# Patient Record
Sex: Female | Born: 1950 | Race: White | Hispanic: No | Marital: Married | Smoking: Never smoker
Health system: Southern US, Community
[De-identification: ages and names within clinical notes are randomized; demographics above are authoritative.]

## PROBLEM LIST (undated history)

## (undated) DIAGNOSIS — E7800 Pure hypercholesterolemia, unspecified: Secondary | ICD-10-CM

## (undated) DIAGNOSIS — G309 Alzheimer's disease, unspecified: Secondary | ICD-10-CM

## (undated) DIAGNOSIS — R079 Chest pain, unspecified: Secondary | ICD-10-CM

## (undated) DIAGNOSIS — R5381 Other malaise: Secondary | ICD-10-CM

## (undated) DIAGNOSIS — R413 Other amnesia: Principal | ICD-10-CM

## (undated) DIAGNOSIS — F0280 Dementia in other diseases classified elsewhere without behavioral disturbance: Secondary | ICD-10-CM

## (undated) DIAGNOSIS — F341 Dysthymic disorder: Secondary | ICD-10-CM

## (undated) DIAGNOSIS — K589 Irritable bowel syndrome without diarrhea: Secondary | ICD-10-CM

## (undated) DIAGNOSIS — R0602 Shortness of breath: Secondary | ICD-10-CM

## (undated) DIAGNOSIS — I10 Essential (primary) hypertension: Secondary | ICD-10-CM

## (undated) DIAGNOSIS — R5383 Other fatigue: Secondary | ICD-10-CM

## (undated) HISTORY — DX: Dementia in other diseases classified elsewhere without behavioral disturbance: F02.80

## (undated) HISTORY — DX: Irritable bowel syndrome, unspecified: K58.9

## (undated) HISTORY — DX: Pure hypercholesterolemia, unspecified: E78.00

## (undated) HISTORY — DX: Alzheimer's disease, unspecified: G30.9

## (undated) HISTORY — PX: CHOLECYSTECTOMY: SHX55

## (undated) HISTORY — DX: Shortness of breath: R06.02

## (undated) HISTORY — DX: Chest pain, unspecified: R07.9

## (undated) HISTORY — DX: Essential (primary) hypertension: I10

## (undated) HISTORY — DX: Other amnesia: R41.3

## (undated) HISTORY — DX: Other fatigue: R53.83

## (undated) HISTORY — DX: Dysthymic disorder: F34.1

## (undated) HISTORY — DX: Other malaise: R53.81

## (undated) HISTORY — PX: TONSILLECTOMY: SUR1361

## (undated) HISTORY — PX: APPENDECTOMY: SHX54

---

## 1997-05-31 ENCOUNTER — Encounter: Admission: RE | Admit: 1997-05-31 | Discharge: 1997-08-29 | Payer: Self-pay

## 2003-11-30 ENCOUNTER — Ambulatory Visit (HOSPITAL_COMMUNITY): Admission: RE | Admit: 2003-11-30 | Discharge: 2003-11-30 | Payer: Self-pay | Admitting: Internal Medicine

## 2003-11-30 ENCOUNTER — Ambulatory Visit: Payer: Self-pay | Admitting: Internal Medicine

## 2004-04-24 ENCOUNTER — Ambulatory Visit: Payer: Self-pay | Admitting: Family Medicine

## 2004-08-11 ENCOUNTER — Ambulatory Visit: Payer: Self-pay | Admitting: Family Medicine

## 2004-09-09 ENCOUNTER — Ambulatory Visit: Payer: Self-pay | Admitting: Family Medicine

## 2004-10-22 ENCOUNTER — Ambulatory Visit: Payer: Self-pay | Admitting: Family Medicine

## 2004-11-10 ENCOUNTER — Ambulatory Visit: Payer: Self-pay | Admitting: Family Medicine

## 2004-12-18 ENCOUNTER — Ambulatory Visit: Payer: Self-pay | Admitting: Family Medicine

## 2005-04-10 ENCOUNTER — Ambulatory Visit: Payer: Self-pay | Admitting: Family Medicine

## 2005-04-14 ENCOUNTER — Ambulatory Visit: Payer: Self-pay | Admitting: Family Medicine

## 2005-04-20 ENCOUNTER — Ambulatory Visit: Payer: Self-pay | Admitting: Family Medicine

## 2005-07-01 ENCOUNTER — Ambulatory Visit: Payer: Self-pay | Admitting: Family Medicine

## 2005-11-05 ENCOUNTER — Ambulatory Visit: Payer: Self-pay | Admitting: Family Medicine

## 2010-04-11 ENCOUNTER — Encounter: Payer: Self-pay | Admitting: Cardiology

## 2010-05-12 ENCOUNTER — Encounter: Payer: Self-pay | Admitting: *Deleted

## 2010-05-12 NOTE — Progress Notes (Signed)
Letter mailed

## 2010-05-13 ENCOUNTER — Ambulatory Visit (INDEPENDENT_AMBULATORY_CARE_PROVIDER_SITE_OTHER): Payer: BC Managed Care – PPO | Admitting: Cardiology

## 2010-05-13 ENCOUNTER — Encounter: Payer: Self-pay | Admitting: *Deleted

## 2010-05-13 ENCOUNTER — Encounter: Payer: Self-pay | Admitting: Cardiology

## 2010-05-13 VITALS — BP 117/81 | HR 73 | Ht 70.0 in | Wt 173.8 lb

## 2010-05-13 DIAGNOSIS — I10 Essential (primary) hypertension: Secondary | ICD-10-CM | POA: Insufficient documentation

## 2010-05-13 DIAGNOSIS — R079 Chest pain, unspecified: Secondary | ICD-10-CM | POA: Insufficient documentation

## 2010-05-13 DIAGNOSIS — R0602 Shortness of breath: Secondary | ICD-10-CM | POA: Insufficient documentation

## 2010-05-13 DIAGNOSIS — E7800 Pure hypercholesterolemia, unspecified: Secondary | ICD-10-CM

## 2010-05-13 NOTE — Assessment & Plan Note (Signed)
The patient's chest pain is atypical. However, she has a very significant history of early coronary disease and an abnormal EKG. Her pretest probability of obstructive coronary disease is at least moderate. Given this exercise perfusion imaging is the appropriate indicated study by ACC/AHA guidelines.

## 2010-05-13 NOTE — Progress Notes (Signed)
HPI The patient presents for evaluation of chest discomfort. She says she's had this for about 4 or 5 weeks. However, she does not think she's had it recently. I do see that she was in the emergency room on the 13th of this month. I reviewed an EKG which demonstrated lateral T wave inversions which were new compared to previous EKGs. She did not have elevated cardiac enzymes and she had no PE on CT. She reports that she had dyspnea and chest discomfort when chasing a child. She otherwise has intermittent discomfort. She will get some dyspnea climbing her stairs but this is her most significant exertion. She is not describing reproducible chest discomfort, neck or arm discomfort at rest or with exertion but again she sedentary. She's not having any shortness of breath, PND or orthopnea. She's had no nausea vomiting or diaphoresis. She is not describing new palpitations or syncope though she has had some lightheadedness. She has not had prior cardiovascular testing.  Allergies  Allergen Reactions  . Sodium Pentobarbital (Pentobarbital Sodium) Nausea And Vomiting    Current Outpatient Prescriptions  Medication Sig Dispense Refill  . atorvastatin (LIPITOR) 10 MG tablet Take 10 mg by mouth at bedtime.        Marland Kitchen buPROPion (WELLBUTRIN XL) 300 MG 24 hr tablet Take 300 mg by mouth daily.        . fish oil-omega-3 fatty acids 1000 MG capsule Take 2 g by mouth 3 (three) times daily.        . fluticasone (VERAMYST) 27.5 MCG/SPRAY nasal spray 2 sprays by Nasal route daily.        Marland Kitchen LORazepam (ATIVAN) 0.5 MG tablet Take 0.5 mg by mouth 3 (three) times daily.       Marland Kitchen omeprazole (PRILOSEC) 20 MG capsule Take 20 mg by mouth daily.        . ondansetron (ZOFRAN) 4 MG tablet Take 4 mg by mouth every 8 (eight) hours as needed.        . temazepam (RESTORIL) 15 MG capsule Take 30 mg by mouth at bedtime as needed.       . venlafaxine (EFFEXOR-XR) 150 MG 24 hr capsule Take 150 mg by mouth daily.        Marland Kitchen aspirin 81 MG  tablet Take 81 mg by mouth daily.        Marland Kitchen DISCONTD: alendronate (FOSAMAX) 70 MG tablet Take 70 mg by mouth every 7 (seven) days. Take with a full glass of water on an empty stomach.       . DISCONTD: celecoxib (CELEBREX) 200 MG capsule Take 200 mg by mouth daily.        Marland Kitchen DISCONTD: dexlansoprazole (DEXILANT) 60 MG capsule Take 60 mg by mouth daily.          Past Medical History  Diagnosis Date  . Shortness of breath   . Chest pain, unspecified   . Other malaise and fatigue   . Dysthymic disorder   . Unspecified essential hypertension   . Pure hypercholesterolemia     Past Surgical History  Procedure Date  . Tonsillectomy   . Appendectomy     Family History  Problem Relation Age of Onset  . Coronary artery disease Father 78    Died MI age 9  . Alzheimer's disease Mother     Pacemaker  . Coronary artery disease Brother 40    Heart attacks in his 31s    History   Social History  . Marital Status: Married  Spouse Name: [name]    Number of Children: 1  . Years of Education: N/A   Occupational History  . RETIRED    Social History Main Topics  . Smoking status: Never Smoker   . Smokeless tobacco: Not on file  . Alcohol Use: No  . Drug Use: No  . Sexually Active: Not on file   Other Topics Concern  . Not on file   Social History Narrative  . No narrative on file    ROS:  Positive for fatigue, blurred vision, depression, anxiety, difficulty swallowing, heartburn, hemorrhoids. Otherwise as stated in the history of present illness negative for all other systems.  PHYSICAL EXAM BP 117/81  Pulse 73  Ht 5\' 10"  (1.778 m)  Wt 173 lb 12.8 oz (78.835 kg)  BMI 24.94 kg/m2 GENERAL:  Well appearing HEENT:  Pupils equal round and reactive, fundi not visualized, oral mucosa unremarkable NECK:  No jugular venous distention, waveform within normal limits, carotid upstroke brisk and symmetric, no bruits, no thyromegaly LYMPHATICS:  No cervical, inguinal  adenopathy LUNGS:  Clear to auscultation bilaterally BACK:  No CVA tenderness CHEST:  Unremarkable HEART:  PMI not displaced or sustained,S1 and S2 within normal limits, no S3, no S4, no clicks, no rubs, no murmurs ABD:  Flat, positive bowel sounds normal in frequency in pitch, no bruits, no rebound, no guarding, no midline pulsatile mass, no hepatomegaly, no splenomegaly EXT:  2 plus pulses throughout, no edema, no cyanosis no clubbing SKIN:  No rashes no nodules NEURO:  Cranial nerves II through XII grossly intact, motor grossly intact throughout PSYCH:  Cognitively intact, oriented to person place and time   EKG:  See above  ASSESSMENT AND PLAN

## 2010-05-13 NOTE — Patient Instructions (Signed)
Your physician has requested that you have en exercise stress cardiolite. For further information please visit [email or web address] Please follow instruction sheet, as given. Your follow up will be based on your test results. If the results of your test are normal or stable, you will receive a letter. If they are abnormal, the nurse will contact you by phone.

## 2010-05-13 NOTE — Assessment & Plan Note (Signed)
Her blood pressure is controlled and she will continue the meds as listed 

## 2010-05-13 NOTE — Assessment & Plan Note (Signed)
I would suggest a goal LDL less than 100 and HDL greater than 40 and I will defer to her primary provider.

## 2010-05-23 DIAGNOSIS — R079 Chest pain, unspecified: Secondary | ICD-10-CM

## 2010-06-04 ENCOUNTER — Other Ambulatory Visit (HOSPITAL_COMMUNITY): Payer: BC Managed Care – PPO

## 2010-06-05 ENCOUNTER — Other Ambulatory Visit (HOSPITAL_COMMUNITY): Payer: BC Managed Care – PPO

## 2010-06-06 ENCOUNTER — Other Ambulatory Visit: Payer: Self-pay | Admitting: General Surgery

## 2010-06-06 ENCOUNTER — Encounter (HOSPITAL_COMMUNITY): Payer: BC Managed Care – PPO

## 2010-06-06 LAB — COMPREHENSIVE METABOLIC PANEL
ALT: 13 U/L (ref 0–35)
AST: 17 U/L (ref 0–37)
Albumin: 4.1 g/dL (ref 3.5–5.2)
Alkaline Phosphatase: 71 U/L (ref 39–117)
Alkaline Phosphatase: 71 U/L (ref 39–117)
BUN: 10 mg/dL (ref 6–23)
BUN: 10 mg/dL (ref 6–23)
CO2: 28 mEq/L (ref 19–32)
CO2: 28 mEq/L (ref 19–32)
Calcium: 9.4 mg/dL (ref 8.4–10.5)
Chloride: 103 mEq/L (ref 96–112)
Creatinine, Ser: 0.88 mg/dL (ref 0.4–1.2)
GFR calc Af Amer: >60 mL/min (ref >60)
GFR calc non Af Amer: >60 mL/min (ref >60)
GFR calc non Af Amer: >60 mL/min (ref >60)
Glucose, Bld: 86 mg/dL (ref 70–99)
Glucose, Bld: 86 mg/dL (ref 70–99)
Potassium: 4.3 mEq/L (ref 3.5–5.1)
Sodium: 140 mEq/L (ref 135–145)
Total Bilirubin: 0.6 mg/dL (ref 0.3–1.2)
Total Bilirubin: 0.6 mg/dL (ref 0.3–1.2)
Total Protein: 7.2 g/dL (ref 6.0–8.3)
Total Protein: 7.2 g/dL (ref 6.0–8.3)

## 2010-06-06 LAB — CBC
HCT: 38.5 % (ref 36.0–46.0)
MCHC: 31.7 g/dL (ref 30.0–36.0)
MCV: 76.8 fL — ABNORMAL LOW (ref 78.0–100.0)
RDW: 14.4 % (ref 11.5–15.5)
WBC: 5.1 10*3/uL (ref 4.0–10.5)

## 2010-06-06 LAB — DIFFERENTIAL
Eosinophils Relative: 1 % (ref 0–5)
Lymphocytes Relative: 39 % (ref 12–46)
Lymphs Abs: 2 10*3/uL (ref 0.7–4.0)
Monocytes Absolute: 0.5 10*3/uL (ref 0.1–1.0)

## 2010-06-06 LAB — SURGICAL PCR SCREEN: MRSA, PCR: NEGATIVE

## 2010-06-12 ENCOUNTER — Other Ambulatory Visit: Payer: Self-pay | Admitting: General Surgery

## 2010-06-12 ENCOUNTER — Ambulatory Visit (HOSPITAL_COMMUNITY): Payer: BC Managed Care – PPO

## 2010-06-12 ENCOUNTER — Ambulatory Visit (HOSPITAL_COMMUNITY)
Admission: RE | Admit: 2010-06-12 | Discharge: 2010-06-12 | Disposition: A | Discharge status: Home / Self Care | Payer: BC Managed Care – PPO | Source: Ambulatory Visit | Attending: General Surgery | Admitting: General Surgery

## 2010-06-12 DIAGNOSIS — K8010 Calculus of gallbladder with chronic cholecystitis without obstruction: Secondary | ICD-10-CM | POA: Insufficient documentation

## 2010-06-12 DIAGNOSIS — F341 Dysthymic disorder: Secondary | ICD-10-CM | POA: Insufficient documentation

## 2010-06-12 DIAGNOSIS — R1013 Epigastric pain: Secondary | ICD-10-CM | POA: Insufficient documentation

## 2010-06-12 DIAGNOSIS — K219 Gastro-esophageal reflux disease without esophagitis: Secondary | ICD-10-CM | POA: Insufficient documentation

## 2010-06-18 NOTE — Op Note (Signed)
[name], [name]             ACCOUNT NO.:  [id number]  MEDICAL RECORD NO.:  [id number]           PATIENT TYPE:  O  LOCATION:  DAYL                         FACILITY:  [facility]  PHYSICIAN:  Anselm Pancoast. Nechama Escutia, M.D.DATE OF BIRTH:  [birth year]  DATE OF PROCEDURE:  06/12/2010 DATE OF DISCHARGE:  06/12/2010                              OPERATIVE REPORT   PREOPERATIVE DIAGNOSIS:  Chronic cholecystitis with stones.  OPERATIONS:  Laparoscopic cholecystectomy with cholangiogram.  ANESTHESIA:  General anesthesia.  HISTORY:  Ms. Destiny Lopez is a 60 year old Caucasian female, who was referred to [place] at the courtesy of Dr. Lysbeth Galas with symptomatic gallstones. Patient has a history of acid reflux, high fever, past history of depression and has had episodes of epigastric pain and she has had a known history of gallstones she says for about 10 years and thought that symptoms were related to reflux.  Recently about 4:00 a.m., she had severe onset of epigastric pain, went to the chest, went to the Emergency Room at [facility] and they did a combination of test.  Her white count was not elevated.  Her hematocrit was normal.  Liver tests were not elevated and the pain went into her chest.  I got a chest x- ray, EKG.  They have a chest CT one and she had pulmonary embolism. They did repeated an ultrasound and it showed that she had small gallstones.  The chest CT showed a little nodule in her thyroid that has been worked up.  She has not been informed of what the results of the biopsy was, but the ultrasound showed that she had numerous little stones within the gallbladder and most likely the symptoms of pain going up into her chest was probably a passage of the common duct stone.  She was seen in our office approximately 3 weeks ago and I recommended that we go ahead and proceed with the laparoscopic cholecystectomy and cholangiogram since these episodes of pain and hopefully symptoms that she has  previously contributed to acid reflux possibly could be related to gallstones.  DESCRIPTION OF PROCEDURE:  Patient's preoperatively repeat liver function studies were normal and we gave her 3 grams of Unasyn.  She has PAS stockings, induction of general anesthesia, endotracheal tube and then the abdomen was prepped with Betadine solution and draped in a sterile manner.  A small incision was made below the umbilicus.  The fascia was identified.  A small vertical incision was made.  Two Kochers were used and then the underlying peritoneum were carefully opened with a blunt hemostat and a pursestring suture of 0 Vicryl placed.  The gallbladder was not acutely inflamed.  It was fairly prominent in size. The upper 10 mm trocar was placed under direct vision after anesthetizing the fascia and then Dr. Magnus Ivan, the assistant placed two lateral 5 mm trocars.  She had a lot of adhesions up around the gallbladder, which were carefully taken down, exposing the more proximal portion of the gallbladder.  The cystic duct was a little bit prominent and I put a clip on the junction of the gallbladder cystic duct and then kind of milked the cystic duct,  could not get any actual stones, just a little bit of cholesterol debris and then a catheter was introduced, held in place with a clip and the x-ray obtained.  The contrast goes easily through the distal common bile duct into the duodenum and then with a kind of more forceful injection, I could get the contrast reflux up into the common hepatic and you could see the bifurcation.  There were no obvious stones in the common bile duct or common hepatic duct and then the camera was reinserted and the cystic duct.  I removed the catheter, kind of milked the cystic duct again and then put three clips across the cystic duct and then divided it where I had made the little opening for the cholangiocath.  The proximal portion of the gallbladder identified the  cystic artery that was doubly clipped and then divided distally and then the gallbladder was freed from its bed.  There was a little bit of bile spillage as we were trying to dissect out the gallbladder from the liver and after the gallbladder was freed, I placed it into the EndoCatch bag.  I thoroughly irrigated, reinspected the gallbladder fossa.  There was a couple of little areas that were likely irrigated.  The clips appeared to be in good position and all the irrigating fluid was aspirated.  The camera was then switched to the upper 10 mm port.  The bag containing the gallbladder was withdrawn in the umbilicus and I put an additional figure-of-eight of 0 Vicryl, tied both it in the pursestring and then anesthetized the fascia with Marcaine with adrenaline.  The CO2 was released.  The little bit of remaining irrigating fluid had been aspirated and then the upper 10 mm trocar was withdrawn after the 5-mm ports had been withdrawn under direct vision.  The subcutaneous wounds were closed with 4-0 Monocryl. Benzoin and Steri-Strips were placed on the skin.  Patient tolerated the procedure nicely and I placed one of the small stones into a little separate container to get it to the patient's husband in case there would be any other stones in the common bile duct that were not seen on the cholangiogram that I would pass, but they would give her another episode of pain like she has had when she presented to the Emergency Room at [facility].     Anselm Pancoast. Zachery Dakins, M.D.     WJW/MEDQ  D:  06/12/2010  T:  06/12/2010  Job:  [id number]  cc:   Anselm Pancoast. Zachery Dakins, M.D. [address] [place] [address]  Delaney Meigs, M.D. Fax: [phone]  Electronically Signed by Consuello Bossier M.D. on 06/18/2010 01:38:09 PM

## 2011-06-04 ENCOUNTER — Other Ambulatory Visit: Payer: Self-pay | Admitting: Obstetrics and Gynecology

## 2011-08-11 DIAGNOSIS — R413 Other amnesia: Secondary | ICD-10-CM

## 2011-08-18 DIAGNOSIS — R413 Other amnesia: Secondary | ICD-10-CM

## 2011-11-19 DIAGNOSIS — E042 Nontoxic multinodular goiter: Secondary | ICD-10-CM | POA: Insufficient documentation

## 2011-11-19 DIAGNOSIS — F329 Major depressive disorder, single episode, unspecified: Secondary | ICD-10-CM | POA: Insufficient documentation

## 2011-11-19 DIAGNOSIS — E785 Hyperlipidemia, unspecified: Secondary | ICD-10-CM | POA: Insufficient documentation

## 2011-11-19 DIAGNOSIS — M8580 Other specified disorders of bone density and structure, unspecified site: Secondary | ICD-10-CM | POA: Insufficient documentation

## 2012-09-18 ENCOUNTER — Other Ambulatory Visit: Payer: Self-pay

## 2012-09-18 MED ORDER — DONEPEZIL HCL 5 MG PO TABS: 5.0000 mg | ORAL_TABLET | Freq: Every day | ORAL | Status: DC

## 2012-10-04 ENCOUNTER — Encounter: Payer: Self-pay | Admitting: Nurse Practitioner

## 2012-10-04 ENCOUNTER — Ambulatory Visit (INDEPENDENT_AMBULATORY_CARE_PROVIDER_SITE_OTHER): Payer: BC Managed Care – PPO | Admitting: Nurse Practitioner

## 2012-10-04 VITALS — BP 140/78 | HR 66 | Ht 68.0 in | Wt 175.0 lb

## 2012-10-04 DIAGNOSIS — F079 Unspecified personality and behavioral disorder due to known physiological condition: Secondary | ICD-10-CM | POA: Insufficient documentation

## 2012-10-04 DIAGNOSIS — R9409 Abnormal results of other function studies of central nervous system: Secondary | ICD-10-CM | POA: Insufficient documentation

## 2012-10-04 NOTE — Progress Notes (Signed)
I have read the note, and I agree with the clinical assessment and plan.  Justen Fonda KEITH   

## 2012-10-04 NOTE — Patient Instructions (Addendum)
Continue Aricept 5 mg daily, call for refills Memory score is stable Followup yearly and when necessary

## 2012-10-04 NOTE — Progress Notes (Signed)
[facility]  PATIENT: Destiny Lopez DOB: [birth year]   REASON FOR VISIT: Followup for memory disorder   HISTORY OF PRESENT ILLNESS: Destiny Lopez is a 62 year old right-handed white female with a history of a slowly progressive cognitive disorder. She was last seen in this office by Dr. Anne Hahn 09/29/2011 .The patient has undergone MRI evaluation of the brain showing hemosiderin deposits on the left hemisphere and generalized cerebral atrophy and mild degree of chronic microvascular ischemia The patient indicates that she has not had a prior history of head trauma. 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 The patient has not had any significant changes in her underlying depression. MRA of the brain after her last visit shows atherosclerosis but no aneurysm. On return visit today patient is hesitant to answer questions. Husband reports word finding problems, more confusion in conversations. He continues to work and is worried about her being left alone at home. She is currently on Aricept denying any side effects to the medication. She can still perform her activities of daily living however she cannot manage finances and other activities that require executive function.    REVIEW OF SYSTEMS: Full 14 system review of systems performed and notable only for:  Constitutional: N/A  Cardiovascular: N/A  Ear/Nose/Throat: Ringing in the ears  Skin: N/A  Eyes: N/A  Respiratory: N/A  Gastroitestinal: Constipation  Hematology/Lymphatic: N/A  Endocrine: N/A Musculoskeletal:N/A  Allergy/Immunology: N/A  Neurological: Memory loss  Psychiatric: N/A   ALLERGIES: Allergies  Allergen Reactions  . Sodium Pentobarbital [Pentobarbital Sodium] Nausea And Vomiting    HOME MEDICATIONS: Outpatient Prescriptions Prior to Visit    Medication Sig Dispense Refill  . aspirin 81 MG tablet Take 81 mg by mouth daily.        Marland Kitchen atorvastatin (LIPITOR) 10 MG tablet Take 10 mg by mouth at bedtime.        Marland Kitchen buPROPion (WELLBUTRIN XL) 300 MG 24 hr tablet Take 300 mg by mouth daily.        Marland Kitchen donepezil (ARICEPT) 5 MG tablet Take 1 tablet (5 mg total) by mouth at bedtime.  30 tablet  0  . fish oil-omega-3 fatty acids 1000 MG capsule Take 2 g by mouth 3 (three) times daily.        . fluticasone (VERAMYST) 27.5 MCG/SPRAY nasal spray 2 sprays by Nasal route daily.        Marland Kitchen LORazepam (ATIVAN) 0.5 MG tablet Take 0.5 mg by mouth 3 (three) times daily.       Marland Kitchen omeprazole (PRILOSEC) 20 MG capsule Take 20 mg by mouth daily.        . ondansetron (ZOFRAN) 4 MG tablet Take 4 mg by mouth every 8 (eight) hours as needed.        . temazepam (RESTORIL) 15 MG capsule Take 30 mg by mouth at bedtime as needed.       . venlafaxine (EFFEXOR-XR) 150 MG 24 hr capsule Take 150 mg by mouth daily.         No facility-administered medications prior to visit.    PAST MEDICAL HISTORY: Past Medical History  Diagnosis Date  . Shortness of breath   . Chest pain, unspecified   . Other malaise and fatigue   . Dysthymic disorder   . Unspecified essential hypertension   . Pure hypercholesterolemia   . Memory loss     PAST SURGICAL  HISTORY: Past Surgical History  Procedure Laterality Date  . Tonsillectomy    . Appendectomy      FAMILY HISTORY: Family History  Problem Relation Age of Onset  . Coronary artery disease Father 58    Died MI age 13  . Alzheimer's disease Mother     Pacemaker  . Coronary artery disease Brother 40    Heart attacks in his 16s    SOCIAL HISTORY: History   Social History  . Marital Status: Married    Spouse Name: [name]    Number of Children: 1  . Years of Education: College degree   Occupational History  . RETIRED    Social History Main Topics  . Smoking status: Never Smoker   . Smokeless tobacco: Never Used   . Alcohol Use: No  . Drug Use: No  . Sexual Activity: Not on file   Other Topics Concern  . Not on file   Social History Narrative  . No narrative on file     PHYSICAL EXAM  Filed Vitals:   10/04/12 0927  BP: 140/78  Pulse: 66  Height: 5\' 8"  (1.727 m)  Weight: 175 lb (79.379 kg)   Body mass index is 26.61 kg/(m^2).  Generalized: Well developed, in no acute distress  Head: normocephalic and atraumatic,. Oropharynx benign  Neck: Supple, no carotid bruits  Cardiac: Regular rate rhythm, no murmur    Neurological examination   Mentation: Alert oriented to time, place, history taking. MMSE 29/30. AFT 8. Follows all commands speech is hesitent. Word finding difficulty.   Cranial nerve II-XII: Pupils were equal round reactive to light extraocular movements were full, visual field were full on confrontational test. Facial sensation and strength were normal. hearing was intact to finger rubbing bilaterally. Uvula tongue midline. head turning and shoulder shrug and were normal and symmetric.Tongue protrusion into cheek strength was normal. Motor: normal bulk and tone, full strength in the BUE, BLE, fine finger movements normal, no pronator drift. No focal weakness Coordination: finger-nose-finger, heel-to-shin bilaterally, no dysmetria Reflexes: Brachioradialis 2/2, biceps 2/2, triceps 2/2, patellar 2/2, Achilles 2/2, plantar responses were flexor bilaterally. Gait and Station: Rising up from seated position without assistance, normal stance, without trunk ataxia, moderate stride, good arm swing, smooth turning, able to perform tiptoe, and heel walking without difficulty. Tandem is unsteady   DIAGNOSTIC DATA (LABS, IMAGING, TESTING) -None to review    ASSESSMENT AND PLAN  62 y.o. year old female  has a past medical history of progressive cognitive disorder and memory loss. here for followup . Patient is currently on Aricept 5 mg daily tolerating without side effects.  Continue  Aricept 5 mg daily, call for refills Memory score is stable Additional 10 minutes spent with husband discussing day care options, [place] live in [place] and he is not aware if there are any daycare centers. I suggest he check with local mental health and they should be able to give him options. Also discussed safety issues in the home. Husband continues to work full-time. Followup yearly and when necessary  Nilda Riggs, [facility], [facility], APRN  [facility] [address] [place], [place] [address] [phone]

## 2012-10-13 ENCOUNTER — Other Ambulatory Visit: Payer: Self-pay | Admitting: Obstetrics and Gynecology

## 2012-10-24 ENCOUNTER — Other Ambulatory Visit: Payer: Self-pay | Admitting: Obstetrics and Gynecology

## 2012-10-24 DIAGNOSIS — R928 Other abnormal and inconclusive findings on diagnostic imaging of breast: Secondary | ICD-10-CM

## 2012-11-07 ENCOUNTER — Other Ambulatory Visit: Payer: BC Managed Care – PPO

## 2012-11-14 ENCOUNTER — Other Ambulatory Visit: Payer: BC Managed Care – PPO

## 2012-11-15 ENCOUNTER — Other Ambulatory Visit: Payer: Self-pay | Admitting: Neurology

## 2012-11-29 ENCOUNTER — Ambulatory Visit
Admission: RE | Admit: 2012-11-29 | Discharge: 2012-11-29 | Disposition: A | Discharge status: Home / Self Care | Payer: BC Managed Care – PPO | Source: Ambulatory Visit | Attending: Obstetrics and Gynecology | Admitting: Obstetrics and Gynecology

## 2012-11-29 DIAGNOSIS — R928 Other abnormal and inconclusive findings on diagnostic imaging of breast: Secondary | ICD-10-CM

## 2012-12-23 ENCOUNTER — Other Ambulatory Visit: Payer: Self-pay | Admitting: Neurology

## 2013-03-17 ENCOUNTER — Telehealth: Payer: Self-pay | Admitting: Neurology

## 2013-03-17 NOTE — Telephone Encounter (Signed)
Dr. Nolen MuMcKinney called concerning this patient. The patient apparently has had a significant change in her cognitive functioning issues over the last several months. We will try to get the patient worked in. In the past, she has had an abnormal MRI the brain with hemosiderin deposits over the left brain. We may need to recheck the MRI. The patient is on low-dose Aricept, and probably could go up on the dose if there are no tolerance issues. I will get a revisit work in.

## 2013-04-10 ENCOUNTER — Encounter: Payer: Self-pay | Admitting: Neurology

## 2013-04-10 ENCOUNTER — Ambulatory Visit (INDEPENDENT_AMBULATORY_CARE_PROVIDER_SITE_OTHER): Payer: BC Managed Care – PPO | Admitting: Neurology

## 2013-04-10 ENCOUNTER — Encounter (INDEPENDENT_AMBULATORY_CARE_PROVIDER_SITE_OTHER): Payer: Self-pay

## 2013-04-10 VITALS — BP 178/76 | HR 52 | Wt 156.0 lb

## 2013-04-10 DIAGNOSIS — R413 Other amnesia: Secondary | ICD-10-CM

## 2013-04-10 DIAGNOSIS — R930 Abnormal findings on diagnostic imaging of skull and head, not elsewhere classified: Secondary | ICD-10-CM

## 2013-04-10 HISTORY — DX: Other amnesia: R41.3

## 2013-04-10 MED ORDER — DONEPEZIL HCL 10 MG PO TABS: 10.0000 mg | ORAL_TABLET | Freq: Every day | ORAL | Status: DC

## 2013-04-10 NOTE — Progress Notes (Signed)
Reason for visit: Memory disorder  Destiny Lopez is an 63 y.o. female  History of present illness:  Destiny Lopez is a 63 year old right-handed white female with a history of a progressive memory disorder. The patient was last seen in September 2014. The patient has had some decline in memory and verbal capacity since that time. The patient is followed by psychiatry, and her psychiatrist felt that there was a significant decline in cognitive ability when she was seen recently. The husband appears to confirm that there has been some changes, it is not clear exactly how rapidly these changes came on. The patient is on low-dose Aricept taking 5 mg at night. The patient has had some recent diarrhea, but she has resolved this issue. The patient has ongoing problems with a runny nose associated with the use of Aricept. The patient returns for an evaluation. The patient still operating a motor vehicle, and she does fairly well when she travels to local areas around her home.  Past Medical History  Diagnosis Date  . Shortness of breath   . Chest pain, unspecified   . Other malaise and fatigue   . Dysthymic disorder   . Unspecified essential hypertension   . Pure hypercholesterolemia   . Memory loss   . Memory disorder 04/10/2013    Past Surgical History  Procedure Laterality Date  . Tonsillectomy    . Appendectomy      Family History  Problem Relation Age of Onset  . Coronary artery disease Father 35    Died MI age 59  . Alzheimer's disease Mother     Pacemaker  . Coronary artery disease Brother 40    Heart attacks in his 64s    Social history:  reports that she has never smoked. She has never used smokeless tobacco. She reports that she does not drink alcohol or use illicit drugs.    Allergies  Allergen Reactions  . Sodium Pentobarbital [Pentobarbital Sodium] Nausea And Vomiting    Medications:  Current Outpatient Prescriptions on File Prior to Visit  Medication Sig  Dispense Refill  . aspirin 81 MG tablet Take 81 mg by mouth daily.        Marland Kitchen atorvastatin (LIPITOR) 10 MG tablet Take 10 mg by mouth at bedtime.        Marland Kitchen buPROPion (WELLBUTRIN XL) 300 MG 24 hr tablet Take 300 mg by mouth daily.        . fish oil-omega-3 fatty acids 1000 MG capsule Take 2 g by mouth 3 (three) times daily.        . fluticasone (VERAMYST) 27.5 MCG/SPRAY nasal spray 2 sprays by Nasal route daily.        Marland Kitchen LORazepam (ATIVAN) 0.5 MG tablet Take 0.5 mg by mouth 3 (three) times daily.       Marland Kitchen omeprazole (PRILOSEC) 20 MG capsule Take 20 mg by mouth daily.        . ondansetron (ZOFRAN) 4 MG tablet Take 4 mg by mouth every 8 (eight) hours as needed.        . temazepam (RESTORIL) 15 MG capsule Take 30 mg by mouth at bedtime as needed.       . venlafaxine (EFFEXOR-XR) 150 MG 24 hr capsule Take 150 mg by mouth daily.         No current facility-administered medications on file prior to visit.    ROS:  Out of a complete 14 system review of symptoms, the patient complains only of the  following symptoms, and all other reviewed systems are negative.  Memory disorder, speech problems  Blood pressure 178/76, pulse 52, weight 156 lb (70.761 kg).  Physical Exam  General: The patient is alert and cooperative at the time of the examination.  Skin: No significant peripheral edema is noted.   Neurologic Exam  Mental status: The Mini-Mental status examination done today shows a total score of 27/30.  Cranial nerves: Facial symmetry is present. Speech is normal, no aphasia or dysarthria is noted. Extraocular movements are full. Visual fields are full.  Motor: The patient has good strength in all 4 extremities.  Sensory examination: Sulfa sensation is symmetric on the face, arms, and legs.  Coordination: The patient has good finger-nose-finger and heel-to-shin bilaterally.  Gait and station: The patient has a normal gait. Tandem gait is normal. Romberg is negative. No drift is seen. Mild  apraxia with the use of the extremities is noted.  Reflexes: Deep tendon reflexes are symmetric.   Assessment/Plan:  1. Progressive memory disorder  2. Abnormal MRI brain  The patient has a history of hemosiderin deposits over the left hemisphere the brain. The patient has had some gradual worsening of speech, and her psychiatrist is concerned that this process has significantly accelerated. The patient will be sent for MRI evaluation of the brain once again. 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 The patient will followup in about 6 months.  Marlan Palau. Keith Willis MD 04/10/2013 8:28 PM  [facility] [address] [place], [place] [id number]  Phone [phone] Fax [phone]

## 2013-04-26 ENCOUNTER — Ambulatory Visit (INDEPENDENT_AMBULATORY_CARE_PROVIDER_SITE_OTHER): Payer: BC Managed Care – PPO

## 2013-04-26 DIAGNOSIS — R930 Abnormal findings on diagnostic imaging of skull and head, not elsewhere classified: Secondary | ICD-10-CM

## 2013-04-26 DIAGNOSIS — R413 Other amnesia: Secondary | ICD-10-CM

## 2013-04-27 ENCOUNTER — Telehealth: Payer: Self-pay | Admitting: Neurology

## 2013-04-27 NOTE — Telephone Encounter (Signed)
I called patient. MRI the brain continues show hemosiderin on the left parietal area and left frontal area, similar to what was seen previously. The patient is on Aricept at 10 mg at night at this point. The prior study was done in 2013 at [facility].   MRI brain 04/27/2013:  IMPRESSION:  Abnormal MRI brain (without) demonstrating:  1. Mild frontal and moderate perisylvian atrophy.  2. Cortical hemosiderin staining in the left parietal region, and slightly in the left frontal region. Could be related to amyloid pathology, prior trauma, occult vascular malformation or prior subdural hematoma.  3. Prior MRI study not available at time of interpretation. Addendum will be made when prior comparison is available.

## 2013-04-28 ENCOUNTER — Telehealth: Payer: Self-pay | Admitting: Neurology

## 2013-04-28 NOTE — Telephone Encounter (Signed)
Patient's husband called to state that he would like a call back regarding patient's MRI results. Patient has dementia and could not remember what was discussed in the phone call with Dr Anne HahnWillis. Please call patient's husband at his cell phone number.

## 2013-04-28 NOTE — Telephone Encounter (Signed)
I called patient. I talked with her husband. Discussed the results of the MRI study. No change from prior study. The patient is on a higher dose of Aricept, 10 mg.

## 2013-10-04 ENCOUNTER — Encounter (INDEPENDENT_AMBULATORY_CARE_PROVIDER_SITE_OTHER): Payer: Self-pay

## 2013-10-04 ENCOUNTER — Ambulatory Visit (INDEPENDENT_AMBULATORY_CARE_PROVIDER_SITE_OTHER): Payer: Medicare HMO | Admitting: Nurse Practitioner

## 2013-10-04 ENCOUNTER — Encounter: Payer: Self-pay | Admitting: Nurse Practitioner

## 2013-10-04 VITALS — BP 150/85 | HR 86 | Wt 150.5 lb

## 2013-10-04 DIAGNOSIS — R9409 Abnormal results of other function studies of central nervous system: Secondary | ICD-10-CM

## 2013-10-04 DIAGNOSIS — R413 Other amnesia: Secondary | ICD-10-CM

## 2013-10-04 MED ORDER — DONEPEZIL HCL 10 MG PO TABS: 10.0000 mg | ORAL_TABLET | Freq: Every day | ORAL | Status: DC

## 2013-10-04 MED ORDER — MEMANTINE HCL ER 28 MG PO CP24: 28.0000 mg | ORAL_CAPSULE | Freq: Every day | ORAL | Status: DC

## 2013-10-04 NOTE — Patient Instructions (Addendum)
Continue Aricept at the current dose will refill Namenda starter pack with voucher Followup in 6 months

## 2013-10-04 NOTE — Progress Notes (Signed)
[facility]  PATIENT: Destiny Lopez DOB: 1950-09-19   REASON FOR VISIT: Followup for memory loss   HISTORY OF PRESENT ILLNESS:Destiny Lopez is a 63 year old right-handed white female with a history of a progressive memory disorder. The patient was last seen by Dr. Anne Hahn 04/10/2013. The patient continues with decline in memory and verbal capacity since that time. The patient is followed by psychiatry. The husband appears to confirm that there has been some changes, her memory has worsened . The patient is on  Aricept taking 10 mg at night without side effects. The patient has ongoing problems with a runny nose associated with the use of Aricept.  She sleeps well  5 of 7 nights. She has good appetite.  She plays scrabble to stimulate her  Memory. She can still perform her activities of daily living however she cannot manage finances and other activities that require executive function. MRI of the brain were 4/16/ 2015 without significant change from 06/29/2011. Mild frontal atrophy noted The patient returns for an evaluation.  REVIEW OF SYSTEMS: Full 14 system review of systems performed and notable only for those listed, all others are neg:  Constitutional: N/A  Cardiovascular: N/A  Ear/Nose/Throat: N/A  Skin: N/A  Eyes: N/A  Respiratory: N/A  Gastroitestinal: N/A  Hematology/Lymphatic: N/A  Endocrine:  intolerance to cold  Musculoskeletal:N/A  Allergy/Immunology: N/A  Neurological: memory loss , word finding problems  Psychiatric:  decreased concentration, confusion Sleep : NA   ALLERGIES: Allergies  Allergen Reactions  . Dexlansoprazole Other (See Comments)  . Sodium Pentobarbital [Pentobarbital Sodium] Nausea And Vomiting    HOME MEDICATIONS: Outpatient Prescriptions Prior to Visit  Medication Sig Dispense Refill  . aspirin 81 MG tablet Take 81 mg by mouth daily.        Marland Kitchen atorvastatin (LIPITOR) 10 MG tablet Take 10 mg by mouth at bedtime.        Marland Kitchen  buPROPion (WELLBUTRIN XL) 300 MG 24 hr tablet Take 300 mg by mouth daily.        Marland Kitchen donepezil (ARICEPT) 10 MG tablet Take 1 tablet (10 mg total) by mouth at bedtime.  90 tablet  3  . fish oil-omega-3 fatty acids 1000 MG capsule Take 2 g by mouth 3 (three) times daily.        . fluticasone (FLONASE) 50 MCG/ACT nasal spray Place 50 sprays into both nostrils daily.      . fluticasone (VERAMYST) 27.5 MCG/SPRAY nasal spray 2 sprays by Nasal route daily.        Marland Kitchen KLOR-CON M20 20 MEQ tablet Take 20 mEq by mouth daily.      Marland Kitchen LORazepam (ATIVAN) 0.5 MG tablet Take 0.5 mg by mouth 3 (three) times daily.       Marland Kitchen omeprazole (PRILOSEC) 20 MG capsule Take 20 mg by mouth daily.        . ondansetron (ZOFRAN) 4 MG tablet Take 4 mg by mouth every 8 (eight) hours as needed.        . temazepam (RESTORIL) 15 MG capsule Take 30 mg by mouth at bedtime as needed.       . venlafaxine (EFFEXOR-XR) 150 MG 24 hr capsule Take 150 mg by mouth daily.         No facility-administered medications prior to visit.    PAST MEDICAL HISTORY: Past Medical History  Diagnosis Date  . Shortness of breath   . Chest pain, unspecified   . Other malaise and fatigue   . Dysthymic  disorder   . Unspecified essential hypertension   . Pure hypercholesterolemia   . Memory loss   . Memory disorder 04/10/2013    PAST SURGICAL HISTORY: Past Surgical History  Procedure Laterality Date  . Tonsillectomy    . Appendectomy      FAMILY HISTORY: Family History  Problem Relation Age of Onset  . Coronary artery disease Father 87    Died MI age 40  . Alzheimer's disease Mother     Pacemaker  . Coronary artery disease Brother 40    Heart attacks in his 70s    SOCIAL HISTORY: History   Social History  . Marital Status: Married    Spouse Name: [name]    Number of Children: 1  . Years of Education: college   Occupational History  . RETIRED    Social History Main Topics  . Smoking status: Never Smoker   . Smokeless tobacco:  Never Used  . Alcohol Use: No  . Drug Use: No  . Sexual Activity: Not on file   Other Topics Concern  . Not on file   Social History Narrative   Patient lives at home with her husband [name])   Education college.   Right handed.   Caffeine un sweet tea.     PHYSICAL EXAM  Filed Vitals:   10/04/13 1328  BP: 150/85  Pulse: 86  Weight: 150 lb 8 oz (68.266 kg)   Body mass index is 22.89 kg/(m^2). General: The patient is alert and cooperative at the time of the examination.  Skin: No significant peripheral edema is noted.  Neurologic Exam  Mental status: The Mini-Mental status examination done today shows a total score of 20/30.Last 27/30. AFT 9. Cranial nerves: Facial symmetry is present. Speech is normal, no aphasia or dysarthria is noted. Extraocular movements are full. Visual fields are full.  Motor: The patient has good strength in all 4 extremities.  Sensory examination: Good  sensation is symmetric on the face, arms, and legs.  Coordination: The patient has good finger-nose-finger and heel-to-shin bilaterally.  Gait and station: The patient has a normal gait. Tandem gait is normal. Romberg is negative. No drift is seen. Mild apraxia with the use of the extremities is noted.  Reflexes: Deep tendon reflexes are symmetric  DIAGNOSTIC DATA (LABS, IMAGING, TESTING) - ASSESSMENT AND PLAN  63 y.o. year old female  has a past medical history of  Memory disorder (04/10/2013).  which has been progressive. MMSE today 20/30.   Continue Aricept at the current dose will refill Namenda starter pack with voucher, call after starter pack completed Followup in 6 months Nilda Riggs, [facility], APRN  [facility] [address], [address] [place], [place] [address] [phone]

## 2013-10-04 NOTE — Progress Notes (Signed)
I have read the note, and I agree with the clinical assessment and plan.  Elianah Karis KEITH   

## 2013-11-13 ENCOUNTER — Other Ambulatory Visit: Payer: Self-pay | Admitting: Dermatology

## 2013-11-29 ENCOUNTER — Other Ambulatory Visit: Payer: Self-pay

## 2013-11-29 ENCOUNTER — Encounter: Payer: Self-pay | Admitting: Neurology

## 2013-11-29 MED ORDER — MEMANTINE HCL ER 28 MG PO CP24: 28.0000 mg | ORAL_CAPSULE | Freq: Every day | ORAL | Status: DC

## 2013-12-05 ENCOUNTER — Encounter: Payer: Self-pay | Admitting: Neurology

## 2013-12-11 ENCOUNTER — Other Ambulatory Visit: Payer: Self-pay | Admitting: Obstetrics and Gynecology

## 2013-12-13 LAB — CYTOLOGY - PAP

## 2014-03-31 ENCOUNTER — Other Ambulatory Visit: Payer: Self-pay | Admitting: Neurology

## 2014-04-02 ENCOUNTER — Telehealth: Payer: Self-pay

## 2014-04-02 NOTE — Telephone Encounter (Signed)
Spoke to spouse. Reschedule 04/05/14 appt to 05/04/14 due to change in provider's schedule.

## 2014-04-04 ENCOUNTER — Ambulatory Visit: Payer: Medicare HMO | Admitting: Nurse Practitioner

## 2014-05-04 ENCOUNTER — Ambulatory Visit (INDEPENDENT_AMBULATORY_CARE_PROVIDER_SITE_OTHER): Payer: Medicare PPO | Admitting: Nurse Practitioner

## 2014-05-04 ENCOUNTER — Encounter: Payer: Self-pay | Admitting: Nurse Practitioner

## 2014-05-04 VITALS — BP 150/84 | HR 60 | Ht 68.0 in | Wt 152.8 lb

## 2014-05-04 DIAGNOSIS — F418 Other specified anxiety disorders: Secondary | ICD-10-CM | POA: Diagnosis not present

## 2014-05-04 DIAGNOSIS — I10 Essential (primary) hypertension: Secondary | ICD-10-CM | POA: Diagnosis not present

## 2014-05-04 DIAGNOSIS — F419 Anxiety disorder, unspecified: Secondary | ICD-10-CM

## 2014-05-04 DIAGNOSIS — R413 Other amnesia: Secondary | ICD-10-CM

## 2014-05-04 DIAGNOSIS — F329 Major depressive disorder, single episode, unspecified: Secondary | ICD-10-CM

## 2014-05-04 NOTE — Patient Instructions (Signed)
Continue Namenda at current dose Continue Aricept at current dose Check to see what you have available in the community for community resources center for aging etc. Memory score has improved Follow-up in 6 months Next visit with Dr. Anne HahnWillis

## 2014-05-04 NOTE — Progress Notes (Signed)
I have read the note, and I agree with the clinical assessment and plan.  WILLIS,CHARLES KEITH   

## 2014-05-04 NOTE — Progress Notes (Signed)
[facility]  PATIENT: Destiny Lopez DOB: 1950/08/12   REASON FOR VISIT: Follow-up for memory loss, anxiety depression HISTORY FROM: Patient and husband    HISTORY OF PRESENT ILLNESS:Destiny Lopez is a 64 year old right-handed white female with a history of a progressive memory disorder. The patient was last seen 10/04/2013 . At that time her memory score was 20 out of 30 and Namenda was added. The husband feels she  continues with decline in memory and verbal capacity. Her memory score today however is 25 out of 30 missing only one of 3 short-term memory questions. The patient is followed by psychiatry, Dr. Nolen Mu who placed her on Nuedexta this week.  The patient is on Aricept taking 10 mg at night without side effects.  She sleeps well 6 of 7 nights. She has good appetite.  She can still perform her activities of daily living however she cannot manage finances and other activities that require executive function. MRI of the brain were 4/16/ 2015 without significant change from 06/29/2011. Mild frontal atrophy noted. The husband says she continues to ask the same questions over and over. The patient returns for an evaluation.    REVIEW OF SYSTEMS: Full 14 system review of systems performed and notable only for those listed, all others are neg:  Constitutional: Fatigue Cardiovascular: neg Ear/Nose/Throat: neg  Skin: neg Eyes: neg Respiratory: neg Gastroitestinal: Urinary frequency  Hematology/Lymphatic: neg  Endocrine: Intolerance to heat and cold Musculoskeletal: Joint pain back pain neck pain Allergy/Immunology: neg Neurological: Memory loss, speech difficulty Psychiatric: Agitation, confusion depression and anxiety Sleep : neg   ALLERGIES: Allergies  Allergen Reactions  . Dexlansoprazole Other (See Comments)  . Sodium Pentobarbital [Pentobarbital Sodium] Nausea And Vomiting    HOME MEDICATIONS: Outpatient Prescriptions Prior to Visit  Medication  Sig Dispense Refill  . donepezil (ARICEPT) 10 MG tablet Take 1 tablet (10 mg total) by mouth at bedtime. 90 tablet 3  . KLOR-CON M20 20 MEQ tablet Take 20 mEq by mouth daily.    Marland Kitchen LORazepam (ATIVAN) 0.5 MG tablet Take 0.5 mg by mouth 3 (three) times daily.     . mirtazapine (REMERON) 30 MG tablet Take 30 mg by mouth at bedtime.    Marland Kitchen NAMENDA XR 28 MG CP24 24 hr capsule TAKE 1 CAPSULE EVERY DAY 30 capsule 3  . atorvastatin (LIPITOR) 10 MG tablet Take 10 mg by mouth at bedtime.      . fluticasone (FLONASE) 50 MCG/ACT nasal spray Place 50 sprays into both nostrils daily.    . temazepam (RESTORIL) 15 MG capsule Take 30 mg by mouth at bedtime as needed.      No facility-administered medications prior to visit.    PAST MEDICAL HISTORY: Past Medical History  Diagnosis Date  . Shortness of breath   . Chest pain, unspecified   . Other malaise and fatigue   . Dysthymic disorder   . Unspecified essential hypertension   . Pure hypercholesterolemia   . Memory loss   . Memory disorder 04/10/2013    PAST SURGICAL HISTORY: Past Surgical History  Procedure Laterality Date  . Tonsillectomy    . Appendectomy      FAMILY HISTORY: Family History  Problem Relation Age of Onset  . Coronary artery disease Father 73    Died MI age 24  . Alzheimer's disease Mother     Pacemaker  . Coronary artery disease Brother 40    Heart attacks in his 57s    SOCIAL HISTORY: History  Social History  . Marital Status: Married    Spouse Name: [name]  . Number of Children: 1  . Years of Education: college   Occupational History  . RETIRED    Social History Main Topics  . Smoking status: Never Smoker   . Smokeless tobacco: Never Used  . Alcohol Use: No  . Drug Use: No  . Sexual Activity: Not on file   Other Topics Concern  . Not on file   Social History Narrative   Patient lives at home with her husband Destiny Lopez([name])   Education college.   Right handed.   Caffeine un sweet tea.     PHYSICAL  EXAM  Filed Vitals:   05/04/14 1050  BP: 181/84  Pulse: 60  Height: 5\' 8"  (1.727 m)  Weight: 152 lb 12.8 oz (69.31 kg)   Body mass index is 23.24 kg/(m^2). General: The patient is alert and cooperative at the time of the examination.  Skin: No significant peripheral edema is noted.  Neurologic Exam  Mental status: The Mini-Mental status examination done today shows a total score of 25/30.Last 20/30. AFT 7. Clock drawing 3/4.  Cranial nerves: Facial symmetry is present. Speech is normal, no aphasia or dysarthria is noted. Extraocular movements are full. Visual fields are full.  Motor: The patient has good strength in all 4 extremities.  Coordination: The patient has good finger-nose-finger and heel-to-shin bilaterally.  Gait and station: The patient has a normal gait. Tandem gait is normal. Romberg is negative. No drift is seen. Mild apraxia with the use of the extremities is noted.  Reflexes: Deep tendon reflexes are symmetric  DIAGNOSTIC DATA (LABS, IMAGING, TESTING) - ASSESSMENT AND PLAN  64 y.o. year old female  has a past medical history of memory loss which has been progressive. MMSE today 25 out of 30 which has improved on Namenda   Continue Namenda at current dose Continue Aricept at current dose Check to see what you have available in the community for community resources center for aging , respite care etc. Memory score has improved Follow-up in 6 months Next visit with Dr. Woody SellerWillis   Tashiya Souders Carolyn Zyla Dascenzo, [facility]NP, [facility], APRN  [facility] [address], [address] [place], [place] [address] [phone]

## 2014-05-05 ENCOUNTER — Other Ambulatory Visit: Payer: Self-pay | Admitting: Neurology

## 2014-10-11 ENCOUNTER — Encounter: Payer: Self-pay | Admitting: Physician Assistant

## 2014-10-31 ENCOUNTER — Other Ambulatory Visit: Payer: Self-pay | Admitting: Nurse Practitioner

## 2014-11-05 ENCOUNTER — Ambulatory Visit (INDEPENDENT_AMBULATORY_CARE_PROVIDER_SITE_OTHER): Payer: Medicare PPO | Admitting: Physician Assistant

## 2014-11-05 ENCOUNTER — Encounter: Payer: Self-pay | Admitting: Physician Assistant

## 2014-11-05 VITALS — BP 152/86 | HR 64 | Ht 68.0 in | Wt 153.2 lb

## 2014-11-05 DIAGNOSIS — R1310 Dysphagia, unspecified: Secondary | ICD-10-CM

## 2014-11-05 NOTE — Patient Instructions (Signed)
You have been scheduled for a Barium Esophogram [facility]1st floor of the hospital) on 11/08/2014 at 11:00am. Please arrive 15 minutes prior to your appointment for registration. Make certain not to have anything to eat or drink 6 hours prior to your test. If you need to reschedule for any reason, please contact radiology at [phone] to do so. __________________________________________________________________ A barium swallow is an examination that concentrates on views of the esophagus. This tends to be a double contrast exam (barium and two liquids which, when combined, create a gas to distend the wall of the oesophagus) or single contrast (non-ionic iodine based). The study is usually tailored to your symptoms so a good history is essential. Attention is paid during the study to the form, structure and configuration of the esophagus, looking for functional disorders (such as aspiration, dysphagia, achalasia, motility and reflux) EXAMINATION You may be asked to change into a gown, depending on the type of swallow being performed. A radiologist and radiographer will perform the procedure. The radiologist will advise you of the type of contrast selected for your procedure and direct you during the exam. You will be asked to stand, sit or lie in several different positions and to hold a small amount of fluid in your mouth before being asked to swallow while the imaging is performed .In some instances you may be asked to swallow barium coated marshmallows to assess the motility of a solid food bolus. The exam can be recorded as a digital or video fluoroscopy procedure. POST PROCEDURE It will take 1-2 days for the barium to pass through your system. To facilitate this, it is important, unless otherwise directed, to increase your fluids for the next 24-48hrs and to resume your normal diet.  This test typically takes about 30 minutes to  perform. __________________________________________________________________________________

## 2014-11-05 NOTE — Progress Notes (Signed)
Reviewed and agree with management plan.  Karra Pink T. Lillian Tigges, MD FACG 

## 2014-11-05 NOTE — Progress Notes (Signed)
Patient ID: Destiny Lopez, female   DOB: 18-Oct-1950, 64 y.o.   MRN: [id number]   Subjective:    Patient ID: Destiny Lopez, female    DOB: Mar 17, 1950, 64 y.o.   MRN: [id number]  HPI Destiny Lopez is a pleasant 64 year old white female, new to GI today referred by Destiny. Lysbeth Lopez for evaluation of dysphagia. Patient is accompanied by her husband who gives most of the history. Patient has a progressive memory disorder. She has not had prior EGD. He did have cholecystectomy in 2013. She apparently has been having difficulty with swallowing pills over the past month or so. He reports no difficulty with drinking liquids or eating solid food. Complains of feeling like the pills are sitting for prolonged period of time in her chest. She denies any odynophagia, she has not had any regurgitation episodes and denies any heartburn or indigestion. No abdominal pain. Apparently appetite has been fine and weight has been stable.  Review of Systems Pertinent positive and negative review of systems were noted in the above HPI section.  All other review of systems was otherwise negative.  Outpatient Encounter Prescriptions as of 11/05/2014  Medication Sig  . Acetaminophen 650 MG TABS Take 1 tablet by mouth every 8 (eight) hours.  . Dextromethorphan-Quinidine (NUEDEXTA) 20-10 MG CAPS   . donepezil (ARICEPT) 10 MG tablet TAKE 1 TABLET (10 MG TOTAL) BY MOUTH AT BEDTIME.  Marland Kitchen KLOR-CON M20 20 MEQ tablet Take 20 mEq by mouth daily.  Marland Kitchen LORazepam (ATIVAN) 0.5 MG tablet Take 0.5 mg by mouth 3 (three) times daily.   . meloxicam (MOBIC) 7.5 MG tablet TAKE ONE TABLET (7.5 MG TOTAL) BY MOUTH DAILY.  . mirtazapine (REMERON) 30 MG tablet Take 30 mg by mouth at bedtime.  . Multiple Vitamins-Minerals (WOMENS MULTIVITAMIN PLUS PO) Take by mouth.  Marland Kitchen NAMENDA XR 28 MG CP24 24 hr capsule TAKE 1 CAPSULE EVERY DAY  . pravastatin (PRAVACHOL) 20 MG tablet Take 20 mg by mouth daily.  . QUEtiapine (SEROQUEL) 50 MG tablet Take 1 tablet by mouth  daily.  . sucralfate (CARAFATE) 1 GM/10ML suspension Take 1 g by mouth.  . Triamcinolone Acetonide (NASACORT AQ NA) Place into the nose.  Marland Kitchen Dextromethorphan-Quinidine 20-10 MG CAPS Take by mouth. 1 tab qd @@ hs 1st week, 2 tabs per qd after 1st week  . [DISCONTINUED] ciclopirox (PENLAC) 8 % solution Apply 1 application topically daily.  . [DISCONTINUED] NAMENDA XR 28 MG CP24 24 hr capsule TAKE 1 CAPSULE EVERY DAY   No facility-administered encounter medications on file as of 11/05/2014.   Allergies  Allergen Reactions  . Dexlansoprazole Other (See Comments)  . Sodium Pentobarbital [Pentobarbital Sodium] Nausea And Vomiting   Patient Active Problem List   Diagnosis Date Noted  . Memory disorder 04/10/2013  . 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 Other nonspecific abnormal result of function study of brain and central nervous system 10/04/2012  . Anxiety and depression 11/19/2011  . Dyslipidemia 11/19/2011  . Multinodular goiter 11/19/2011  . Osteopenia 11/19/2011  . Chest pain, unspecified   . Shortness of breath   . Chest pain, unspecified   . Essential hypertension   . Pure hypercholesterolemia    Social History   Social History  . Marital Status: Married    Spouse Name: Destiny Lopez  . Number of Children: 1  . Years of Education: college   Occupational History  . RETIRED    Social History Main Topics  . Smoking status: Never Smoker   .  Smokeless tobacco: Never Used  . Alcohol Use: No  . Drug Use: No  . Sexual Activity: Not on file   Other Topics Concern  . Not on file   Social History Narrative   Patient lives at home with her husband Destiny Lopez(Destiny Lopez)   Education college.   Right handed.   Caffeine un sweet tea.    Ms. Destiny Lopez's family history includes Alzheimer's disease in her mother; Coronary artery disease (age of onset: 1940) in her brother; Coronary artery disease (age of onset: 350) in her father.      Objective:      Filed Vitals:   11/05/14 1003  BP: 152/86  Pulse: 64    Physical Exam  well-developed older white female in no acute distress, accompanied by her husband who gives most of the history blood pressure 152/86 pulse 64 height 5 foot 8 weight 153. HEENT; nontraumatic normocephalic EOMI PERRLA sclera anicteric, Cardiovascular; regular rate and rhythm with S1-S2 no murmur or gallop, Pulmonary ;clear bilaterally, Abdomen; soft nontender nondistended bowel sounds are active there is no palpable mass or hepatosplenomegaly exam not done, Ext; is no clubbing cyanosis or edema skin warm and dry, Neuropsych ;patient with dementia answers brief questions appropriately       Assessment & Plan:   #1 64 yo female with new onset pill dysphagia x severeal weeks- r/o motility disorder vs early stricture #2 Dementia- progressive memory disorder #3 anxiety/depression #4HTN  Plan; Will start with Barium swallow with tablet- , may need EGD with Destiny Lopez depending on results. Discussed possible  EGD with pt and husband  Careful swallow of pills one at at time and follow with liquids in the interim     Destiny Lopez S Destiny Plummer PA-C 11/05/2014   Cc: Destiny Lopez, Leonard, MD

## 2014-11-06 ENCOUNTER — Encounter: Payer: Self-pay | Admitting: Neurology

## 2014-11-06 ENCOUNTER — Ambulatory Visit (INDEPENDENT_AMBULATORY_CARE_PROVIDER_SITE_OTHER): Payer: Medicare PPO | Admitting: Neurology

## 2014-11-06 VITALS — BP 132/70 | HR 68 | Resp 14 | Ht 68.0 in | Wt 152.0 lb

## 2014-11-06 DIAGNOSIS — R413 Other amnesia: Secondary | ICD-10-CM | POA: Diagnosis not present

## 2014-11-06 MED ORDER — MEMANTINE HCL-DONEPEZIL HCL ER 28-10 MG PO CP24: 1.0000 | ORAL_CAPSULE | Freq: Every day | ORAL | Status: DC

## 2014-11-06 NOTE — Progress Notes (Signed)
Reason for visit: Memory disorder  Destiny Lopez is an 64 y.o. female  History of present illness:  Destiny Lopez is a 64 year old right-handed white female with a history of progressive memory disorder. The patient has had a decline in cognitive functioning since last seen. She is having increasing problems with word finding, she is not reading as much as she used to, she has not driven in several years. The patient is followed through psychiatry, she is followed by Dr. Prescilla SoursMcKenney. 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 The patient is on Namenda and Aricept, she will have occasional episodes of diarrhea, but this is not frequent. The patient is being evaluated for swallowing issues, the patient will have pills that gets stuck in the throat. The patient denies any issues eating regular food. The patient returns for an evaluation. In general, she is sleeping fairly well.  Past Medical History  Diagnosis Date  . Shortness of breath   . Chest pain, unspecified   . Other malaise and fatigue   . Dysthymic disorder   . Unspecified essential hypertension   . Pure hypercholesterolemia   . Memory loss   . Memory disorder 04/10/2013  . Irritable bowel syndrome     Past Surgical History  Procedure Laterality Date  . Tonsillectomy    . Appendectomy    . Cholecystectomy      Family History  Problem Relation Age of Onset  . Coronary artery disease Father 1850    Died MI age 66  . Alzheimer's disease Mother     Pacemaker  . Coronary artery disease Brother 40    Heart attacks in his 4140s    Social history:  reports that she has never smoked. She has never used smokeless tobacco. She reports that she does not drink alcohol or use illicit drugs.    Allergies  Allergen Reactions  . Dexlansoprazole Other (See Comments)  . Sodium Pentobarbital [Pentobarbital Sodium] Nausea And Vomiting    Medications:  Prior to Admission medications     Medication Sig Start Date End Date Taking? Authorizing Provider  Acetaminophen 650 MG TABS Take 1 tablet by mouth every 8 (eight) hours.   Yes Historical Provider, MD  cetirizine (ZYRTEC) 10 MG tablet Take 10 mg by mouth daily.   Yes Historical Provider, MD  Dextromethorphan-Quinidine (NUEDEXTA) 20-10 MG CAPS  05/19/14  Yes Historical Provider, MD  Dextromethorphan-Quinidine 20-10 MG CAPS Take by mouth. 1 tab qd @@ hs 1st week, 2 tabs per qd after 1st week   Yes Historical Provider, MD  KLOR-CON M20 20 MEQ tablet Take 20 mEq by mouth daily. 04/05/13  Yes Historical Provider, MD  LORazepam (ATIVAN) 0.5 MG tablet Take 0.5 mg by mouth 3 (three) times daily.    Yes Historical Provider, MD  meloxicam (MOBIC) 7.5 MG tablet TAKE ONE TABLET (7.5 MG TOTAL) BY MOUTH DAILY. 08/29/14  Yes Historical Provider, MD  mirtazapine (REMERON) 30 MG tablet Take 30 mg by mouth at bedtime.   Yes Historical Provider, MD  Multiple Vitamins-Minerals (WOMENS MULTIVITAMIN PLUS PO) Take by mouth.   Yes Historical Provider, MD  pravastatin (PRAVACHOL) 20 MG tablet Take 20 mg by mouth daily.   Yes Historical Provider, MD  QUEtiapine (SEROQUEL) 50 MG tablet Take 1 tablet by mouth daily.   Yes Historical Provider, MD  sucralfate (CARAFATE) 1 GM/10ML suspension Take 1 g by mouth. 10/10/14 10/10/15 Yes Historical Provider, MD  Triamcinolone Acetonide (NASACORT ALLERGY 24HR  NA) Place into the nose.   Yes Historical Provider, MD  Memantine HCl-Donepezil HCl (NAMZARIC) 28-10 MG CP24 Take 1 tablet by mouth daily. 11/06/14   York Spaniel, MD    ROS:  Out of a complete 14 system review of symptoms, the patient complains only of the following symptoms, and all other reviewed systems are negative.  Runny nose, difficulty swallowing Cold intolerance Constipation, diarrhea Frequency of urination Memory loss, speech difficulty Confusion, decreased concentration, depression  Blood pressure 132/70, pulse 68, resp. rate 14, height   (1.727 m), weight 152 lb (68.947 kg).  Physical Exam  General: The patient is alert and cooperative at the time of the examination.  Skin: No significant peripheral edema is noted.   Neurologic Exam  Mental status: The patient is alert and oriented x 2 at the time of the examination (not oriented to date). The Mini-Mental Status Examination done today shows total score of 19/30.   Cranial nerves: Facial symmetry is present. Speech is normal, no aphasia or dysarthria is noted. Extraocular movements are full. Visual fields are full.  Motor: The patient has good strength in all 4 extremities.  Sensory examination: Soft touch sensation is symmetric on the face, arms, and legs.  Coordination: The patient has good finger-nose-finger and heel-to-shin bilaterally.  Gait and station: The patient has a normal gait. Tandem gait is normal. Romberg is negative. No drift is seen.  Reflexes: Deep tendon reflexes are symmetric.   Assessment/Plan:  1. Progressive memory disturbance  2. Depression  The patient will be switched to Namzaric taking the 10/28 mg capsules. The patient will follow-up in 6-8 months. The patient continues to have slow progression of her cognitive issues.  Marlan Palau MD 11/06/2014 7:22 PM  [facility] [address] [place], [place] [address]  Phone [phone] Fax [phone]

## 2014-11-08 ENCOUNTER — Ambulatory Visit (HOSPITAL_COMMUNITY)
Admission: RE | Admit: 2014-11-08 | Discharge: 2014-11-08 | Disposition: A | Discharge status: Home / Self Care | Payer: Medicare PPO | Source: Ambulatory Visit | Attending: Physician Assistant | Admitting: Physician Assistant

## 2014-11-08 DIAGNOSIS — R1310 Dysphagia, unspecified: Secondary | ICD-10-CM | POA: Diagnosis not present

## 2014-11-08 DIAGNOSIS — K449 Diaphragmatic hernia without obstruction or gangrene: Secondary | ICD-10-CM | POA: Diagnosis not present

## 2014-11-13 ENCOUNTER — Other Ambulatory Visit: Payer: Self-pay | Admitting: Neurology

## 2015-06-10 ENCOUNTER — Other Ambulatory Visit: Payer: Self-pay | Admitting: Neurology

## 2015-07-09 ENCOUNTER — Ambulatory Visit: Payer: Medicare PPO | Admitting: Nurse Practitioner

## 2015-07-22 ENCOUNTER — Ambulatory Visit: Payer: Medicare PPO | Admitting: Nurse Practitioner

## 2015-07-23 ENCOUNTER — Encounter: Payer: Self-pay | Admitting: Nurse Practitioner

## 2015-07-31 ENCOUNTER — Encounter: Payer: Self-pay | Admitting: Nurse Practitioner

## 2015-07-31 ENCOUNTER — Ambulatory Visit (INDEPENDENT_AMBULATORY_CARE_PROVIDER_SITE_OTHER): Payer: Medicare Other | Admitting: Nurse Practitioner

## 2015-07-31 VITALS — BP 131/76 | HR 68 | Ht 68.0 in | Wt 157.0 lb

## 2015-07-31 DIAGNOSIS — F32A Depression, unspecified: Secondary | ICD-10-CM

## 2015-07-31 DIAGNOSIS — F418 Other specified anxiety disorders: Secondary | ICD-10-CM | POA: Diagnosis not present

## 2015-07-31 DIAGNOSIS — F329 Major depressive disorder, single episode, unspecified: Secondary | ICD-10-CM

## 2015-07-31 DIAGNOSIS — R413 Other amnesia: Secondary | ICD-10-CM | POA: Diagnosis not present

## 2015-07-31 DIAGNOSIS — F419 Anxiety disorder, unspecified: Secondary | ICD-10-CM

## 2015-07-31 DIAGNOSIS — I10 Essential (primary) hypertension: Secondary | ICD-10-CM

## 2015-07-31 MED ORDER — MEMANTINE HCL-DONEPEZIL HCL ER 28-10 MG PO CP24: 1.0000 | ORAL_CAPSULE | Freq: Every day | ORAL | Status: DC

## 2015-07-31 NOTE — Progress Notes (Signed)
[facility]  PATIENT: Destiny KnackChristine P Lopez DOB: 07/21/1950   REASON FOR VISIT: Follow-up for memory disorder HISTORY FROM: Husband    HISTORY OF PRESENT ILLNESS:Ms. Destiny Lopez is a 65 year old right-handed white female with a history of progressive memory disorder. The patient has had a decline in cognitive functioning since last seen. She is having increasing problems with word finding, she is not reading as much as she used to, she has not driven in several years. The patient is followed through psychiatry,  by Dr. Nolen MuMcKinney. 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 The patient is on Namazaric, she will have occasional episodes of diarrhea, but this is not frequent. She can feed and dress and bathe herself. The patient is being evaluated for swallowing issues, the patient will have pills that gets stuck in the throat. The patient denies any issues eating regular food. The patient returns for an evaluation. She is sleeping fairly well.There is no wandering behavior  REVIEW OF SYSTEMS: Full 14 system review of systems performed and notable only for those listed, all others are neg:  Constitutional: neg  Cardiovascular: neg Ear/Nose/Throat: neg  Skin: neg Eyes: neg Respiratory: neg Gastroitestinal: Urinary frequency, occasional diarrhea Hematology/Lymphatic: neg  Endocrine: neg Musculoskeletal:neg Allergy/Immunology: neg Neurological: Memory loss speech difficulty Psychiatric: neg Sleep : neg   ALLERGIES: Allergies  Allergen Reactions  . Dexlansoprazole Other (See Comments)  . Sodium Pentobarbital [Pentobarbital Sodium] Nausea And Vomiting    HOME MEDICATIONS: Outpatient Prescriptions Prior to Visit  Medication Sig Dispense Refill  . Acetaminophen 650 MG TABS Take 1 tablet by mouth every 8 (eight) hours.    . cetirizine (ZYRTEC) 10 MG tablet Take 10 mg by mouth daily.    Marland Kitchen. Dextromethorphan-Quinidine (NUEDEXTA) 20-10  MG CAPS Take 1 capsule by mouth 2 (two) times daily.     Marland Kitchen. KLOR-CON M20 20 MEQ tablet Take 20 mEq by mouth daily.    Marland Kitchen. LORazepam (ATIVAN) 0.5 MG tablet Take 0.5 mg by mouth 3 (three) times daily.     . meloxicam (MOBIC) 7.5 MG tablet TAKE ONE TABLET (7.5 MG TOTAL) BY MOUTH DAILY.    . mirtazapine (REMERON) 30 MG tablet Take 30 mg by mouth at bedtime.    . Multiple Vitamins-Minerals (WOMENS MULTIVITAMIN PLUS PO) Take by mouth.    Marland Kitchen. NAMZARIC 28-10 MG CP24 TAKE 1 TABLET BY MOUTH DAILY. 30 capsule 0  . pravastatin (PRAVACHOL) 20 MG tablet Take 20 mg by mouth daily.    . QUEtiapine (SEROQUEL) 50 MG tablet Take 1 tablet by mouth daily.    . Triamcinolone Acetonide (NASACORT ALLERGY 24HR NA) Place into the nose.    Marland Kitchen. Dextromethorphan-Quinidine 20-10 MG CAPS Take by mouth. 1 tab qd @@ hs 1st week, 2 tabs per qd after 1st week    . sucralfate (CARAFATE) 1 GM/10ML suspension Take 1 g by mouth.     No facility-administered medications prior to visit.    PAST MEDICAL HISTORY: Past Medical History  Diagnosis Date  . Shortness of breath   . Chest pain, unspecified   . Other malaise and fatigue   . Dysthymic disorder   . Unspecified essential hypertension   . Pure hypercholesterolemia   . Memory loss   . Memory disorder 04/10/2013  . Irritable bowel syndrome     PAST SURGICAL HISTORY: Past Surgical History  Procedure Laterality Date  . Tonsillectomy    . Appendectomy    . Cholecystectomy  FAMILY HISTORY: Family History  Problem Relation Age of Onset  . Coronary artery disease Father 22    Died MI age 35  . Alzheimer's disease Mother     Pacemaker  . Coronary artery disease Brother 40    Heart attacks in his 53s    SOCIAL HISTORY: Social History   Social History  . Marital Status: Married    Spouse Name: Destiny Lopez  . Number of Children: 1  . Years of Education: college   Occupational History  . RETIRED    Social History Main Topics  . Smoking status: Never Smoker   .  Smokeless tobacco: Never Used  . Alcohol Use: No  . Drug Use: No  . Sexual Activity: Not on file   Other Topics Concern  . Not on file   Social History Narrative   Patient lives at home with her husband Destiny Lopez)   Education college.   Right handed.   Caffeine un sweet tea.     PHYSICAL EXAM  Filed Vitals:   07/31/15 1405  BP: 131/76  Pulse: 68  Height:  (1.727 m)  Weight: 157 lb (71.215 kg)   Body mass index is 23.88 kg/(m^2).  Generalized: Well developed, in no acute distress  Head: normocephalic and atraumatic,. Oropharynx benign  Neck: Supple, no carotid bruits  Musculoskeletal: No deformity   Neurological examination   Mentation: Alert . MMSE 15 out of 30 missing items in orientation calculation 2 of 3 recall, repetition unable to draw a figure   word finding difficulty  Cranial nerve II-XII: .Pupils were equal round reactive to light extraocular movements were full, visual field were full on confrontational test. Facial sensation and strength were normal. hearing was intact to finger rubbing bilaterally. Uvula tongue midline. head turning and shoulder shrug were normal and symmetric.Tongue protrusion into cheek strength was normal. Motor: normal bulk and tone, full strength in the BUE, BLE,  Sensory: normal and symmetric to light touch, on face arms and legs Coordination: finger-nose-finger, heel-to-shin bilaterally, no dysmetria Reflexes: Symmetric upper and lower plantar responses were flexor bilaterally. Gait and Station: Rising up from seated position without assistance, normal stance,  moderate stride, good arm swing, smooth turning, able to perform tiptoe, and heel walking without difficulty. Tandem gait is steady  DIAGNOSTIC DATA (LABS, IMAGING, TESTING) -  ASSESSMENT AND PLAN  65 y.o. year old female  has a past medical history of progressive memory disturbance and depression.  Continue Namzaric will refill Continue follow-up with  psychiatry Follow-up in 6-8 months Nilda Riggs, [facility], [facility], APRN  [facility] [address], [address] [place], [place] [address] [phone]

## 2015-07-31 NOTE — Progress Notes (Signed)
I have read the note, and I agree with the clinical assessment and plan.  WILLIS,CHARLES KEITH   

## 2015-07-31 NOTE — Patient Instructions (Signed)
Continue Namzaric will refill Follow-up in 6-8 months

## 2015-11-25 ENCOUNTER — Telehealth: Payer: Self-pay | Admitting: *Deleted

## 2015-11-25 MED ORDER — MEMANTINE HCL-DONEPEZIL HCL ER 28-10 MG PO CP24: 1.0000 | ORAL_CAPSULE | Freq: Every day | ORAL | 1 refills | Status: DC

## 2015-11-25 NOTE — Telephone Encounter (Signed)
LMVM for pt ot caregiver to return call about verifying 90 days supply request for namzaric.

## 2015-11-25 NOTE — Telephone Encounter (Signed)
Spoke to husband, and he authorized 90 days supply request.  I told him that will do the request.

## 2016-02-03 ENCOUNTER — Ambulatory Visit (INDEPENDENT_AMBULATORY_CARE_PROVIDER_SITE_OTHER): Payer: Medicare Other | Admitting: Nurse Practitioner

## 2016-02-03 ENCOUNTER — Encounter: Payer: Self-pay | Admitting: Nurse Practitioner

## 2016-02-03 VITALS — BP 124/75 | HR 60 | Ht 68.0 in | Wt 154.6 lb

## 2016-02-03 DIAGNOSIS — R413 Other amnesia: Secondary | ICD-10-CM | POA: Diagnosis not present

## 2016-02-03 NOTE — Progress Notes (Signed)
I have read the note, and I agree with the clinical assessment and plan.  WILLIS,CHARLES KEITH   

## 2016-02-03 NOTE — Patient Instructions (Addendum)
Continue Namzaric for memory Follow-up in 6- months next with Dr. Anne HahnWillis

## 2016-02-03 NOTE — Progress Notes (Signed)
[facility]  PATIENT: Destiny Lopez DOB: 09-12-1950   REASON FOR VISIT: Follow-up for memory disorder HISTORY FROM: Husband and patient    HISTORY OF PRESENT ILLNESS:Destiny Lopez is a 66 year old right-handed white female with a history of progressive memory disorder. The patient has had a decline in cognitive functioning since last seen according to the husband. She can continue to feed,  dress and bathe herself independently. She is having increasing problems with word finding, she is not reading as much as she used to, she has not driven in several years. She is no longer doing crosswords or other memory stimulating activities. She is no longer seeing psychiatry as Dr. Nolen Mu closed her office. Those meds are now being prescribed by Dr. Lysbeth Galas her primary care physician.. The patient is on Namazaric, without significant side effects . The patient denies any issues eating regular food, appetite is good.  She is sleeping fairly well.There is no wandering behavior. She returns for reevaluation  REVIEW OF SYSTEMS: Full 14 system review of systems performed and notable only for those listed, all others are neg:  Constitutional: neg  Cardiovascular: neg Ear/Nose/Throat: neg  Skin: neg Eyes: neg Respiratory: neg Gastroitestinal: neg Hematology/Lymphatic: neg  Endocrine: neg Musculoskeletal:neg Allergy/Immunology: neg Neurological: Memory loss speech difficulty Psychiatric: anxiety depression confusion Sleep : neg   ALLERGIES: Allergies  Allergen Reactions  . Dexlansoprazole Other (See Comments)  . Sodium Pentobarbital [Pentobarbital Sodium] Nausea And Vomiting    HOME MEDICATIONS: Outpatient Medications Prior to Visit  Medication Sig Dispense Refill  . Acetaminophen 650 MG TABS Take 1 tablet by mouth every 8 (eight) hours.    . Calcium Citrate (CITRACAL PO) Take by mouth. 600mg  tabs   Takes 2 tabs daily    . cetirizine (ZYRTEC) 10 MG tablet Take 10 mg by  mouth daily.    Marland Kitchen Dextromethorphan-Quinidine (NUEDEXTA) 20-10 MG CAPS Take 1 capsule by mouth 2 (two) times daily.     Marland Kitchen KLOR-CON M20 20 MEQ tablet Take 20 mEq by mouth daily.    Marland Kitchen LORazepam (ATIVAN) 0.5 MG tablet Take 0.5 mg by mouth 3 (three) times daily.     . Melatonin 3 MG TABS Take 1 tablet by mouth at bedtime.    . meloxicam (MOBIC) 7.5 MG tablet TAKE ONE TABLET (7.5 MG TOTAL) BY MOUTH DAILY.    Marland Kitchen Memantine HCl-Donepezil HCl (NAMZARIC) 28-10 MG CP24 Take 1 tablet by mouth daily. 90 capsule 1  . mirtazapine (REMERON) 30 MG tablet Take 30 mg by mouth at bedtime.    . Multiple Vitamins-Minerals (WOMENS MULTIVITAMIN PLUS PO) Take by mouth.    . pravastatin (PRAVACHOL) 20 MG tablet Take 20 mg by mouth daily.    . QUEtiapine (SEROQUEL) 50 MG tablet Take 1 tablet by mouth daily.    . Triamcinolone Acetonide (NASACORT ALLERGY 24HR NA) Place into the nose.     No facility-administered medications prior to visit.     PAST MEDICAL HISTORY: Past Medical History:  Diagnosis Date  . Chest pain, unspecified   . Dysthymic disorder   . Irritable bowel syndrome   . Memory disorder 04/10/2013  . Memory loss   . Other malaise and fatigue   . Pure hypercholesterolemia   . Shortness of breath   . Unspecified essential hypertension     PAST SURGICAL HISTORY: Past Surgical History:  Procedure Laterality Date  . APPENDECTOMY    . CHOLECYSTECTOMY    . TONSILLECTOMY      FAMILY HISTORY: Family History  Problem Relation Age of Onset  . Coronary artery disease Father 5850    Died MI age 68  . Alzheimer's disease Mother     Pacemaker  . Coronary artery disease Brother 40    Heart attacks in his 3740s    SOCIAL HISTORY: Social History   Social History  . Marital status: Married    Spouse name: [name]  . Number of children: 1  . Years of education: college   Occupational History  . RETIRED    Social History Main Topics  . Smoking status: Never Smoker  . Smokeless tobacco: Never Used   . Alcohol use No  . Drug use: No  . Sexual activity: Not on file   Other Topics Concern  . Not on file   Social History Narrative   Patient lives at home with her husband Destiny Hart([name])   Education college.   Right handed.   Caffeine un sweet tea.     PHYSICAL EXAM  Vitals:   02/03/16 1549  BP: 124/75  Pulse: 60  Weight: 154 lb 9.6 oz (70.1 kg)  Height: 5\' 8"  (1.727 m)   Body mass index is 23.51 kg/m.  Generalized: Well developed, in no acute distress  Head: normocephalic and atraumatic,. Oropharynx benign  Neck: Supple, no carotid bruits  Musculoskeletal: No deformity   Neurological examination   Mentation: Alert . MMSE 15 out of 30 missing items in orientation , calculation 3 of 3 recall, and repetition   Cranial nerve II-XII: .Pupils were equal round reactive to light extraocular movements were full, visual field were full on confrontational test. Facial sensation and strength were normal. hearing was intact to finger rubbing bilaterally. Uvula tongue midline. head turning and shoulder shrug were normal and symmetric.Tongue protrusion into cheek strength was normal. Motor: normal bulk and tone, full strength in the BUE, BLE,  Sensory: normal and symmetric to light touch, on face arms and legs Coordination: finger-nose-finger, heel-to-shin bilaterally, no dysmetria Reflexes: Symmetric upper and lower plantar responses were flexor bilaterally. Gait and Station: Rising up from seated position without assistance, normal stance,  moderate stride, good arm swing, smooth turning, able to perform tiptoe, and heel walking without difficulty. Tandem gait is steady.no assistive device  DIAGNOSTIC DATA (LABS, IMAGING, TESTING) -  ASSESSMENT AND PLAN  66 y.o. year old female  has a past medical history of progressive memory disturbance and depression, here for follow-up she is currently on Namzaric without side effects and stable memory score.  Continue Namzaric does not need  refills Discussed crosswords or other memory stimulating activities  Follow-up in 616-months next with Dr. Anne HahnWillis Greater than 50% of time during this 25 minute visit was spent on counseling,explanation of diagnosis of progressive memory loss, discussion with patient and husband and coordination of care.Discussed the importance of safety issues. Recommend that she get out of the house some instead of watching TV all day Nilda RiggsNancy Carolyn Mariell Nester, [facility] LtdGNP, [facility], APRN  [facility] [address], [address] [place], [place] [address] [phone]

## 2016-06-02 ENCOUNTER — Other Ambulatory Visit: Payer: Self-pay | Admitting: Nurse Practitioner

## 2016-06-29 ENCOUNTER — Encounter: Payer: Self-pay | Admitting: Neurology

## 2016-06-29 ENCOUNTER — Ambulatory Visit (INDEPENDENT_AMBULATORY_CARE_PROVIDER_SITE_OTHER): Payer: Medicare Other | Admitting: Neurology

## 2016-06-29 ENCOUNTER — Encounter (INDEPENDENT_AMBULATORY_CARE_PROVIDER_SITE_OTHER): Payer: Self-pay

## 2016-06-29 VITALS — BP 107/71 | HR 61 | Ht 68.0 in | Wt 150.5 lb

## 2016-06-29 DIAGNOSIS — R413 Other amnesia: Secondary | ICD-10-CM | POA: Diagnosis not present

## 2016-06-29 NOTE — Progress Notes (Signed)
Reason for visit: Memory disorder  Destiny Lopez is an 66 y.o. 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 The patient is on Namzaric, she is tolerating the medication well. She is not having any weight loss issues, she eats well. The patient sleeps well at night. She continues to have a decline in her cognitive status, her verbal capacity is declining. The patient will watch TV or create bead jewelry. The patient does not read much at this time. She requires assistance with figuring out how to use the microwave oven or the remote on the TV. She is able to feed her self, bathe, and dress her self. She does not operate a motor vehicle. She returns for an evaluation. No other new medical issues have come up since last seen.  Past Medical History:  Diagnosis Date  . Chest pain, unspecified   . Dysthymic disorder   . Irritable bowel syndrome   . Memory disorder 04/10/2013  . Memory loss   . Other malaise and fatigue   . Pure hypercholesterolemia   . Shortness of breath   . Unspecified essential hypertension     Past Surgical History:  Procedure Laterality Date  . APPENDECTOMY    . CHOLECYSTECTOMY    . TONSILLECTOMY      Family History  Problem Relation Age of Onset  . Coronary artery disease Father 73       Died MI age 9  . Alzheimer's disease Mother        Pacemaker  . Coronary artery disease Brother 40       Heart attacks in his 37s    Social history:  reports that she has never smoked. She has never used smokeless tobacco. She reports that she does not drink alcohol or use drugs.    Allergies  Allergen Reactions  . Dexlansoprazole Other (See Comments)  . Sodium Pentobarbital [Pentobarbital Sodium] Nausea And Vomiting    Medications:  Prior to Admission medications   Medication Sig Start Date End Date Taking? Authorizing Provider    Acetaminophen 650 MG TABS Take 1 tablet by mouth every 8 (eight) hours.   Yes [provider]  Calcium Citrate (CITRACAL PO) Take by mouth. 600mg  tabs   Takes 2 tabs daily   Yes [provider]  cetirizine (ZYRTEC) 10 MG tablet Take 10 mg by mouth daily.   Yes [provider]  Dextromethorphan-Quinidine (NUEDEXTA) 20-10 MG CAPS Take 1 capsule by mouth 2 (two) times daily.  05/19/14  Yes [provider]  KLOR-CON M20 20 MEQ tablet Take 20 mEq by mouth daily. 04/05/13  Yes [provider]  LORazepam (ATIVAN) 0.5 MG tablet Take 0.5 mg by mouth 3 (three) times daily.    Yes [provider]  Melatonin 3 MG TABS Take 1 tablet by mouth at bedtime.   Yes [provider]  meloxicam (MOBIC) 7.5 MG tablet TAKE ONE TABLET (7.5 MG TOTAL) BY MOUTH DAILY. 08/29/14  Yes [provider]  mirtazapine (REMERON) 30 MG tablet Take 30 mg by mouth at bedtime.   Yes [provider]  Multiple Vitamins-Minerals (WOMENS MULTIVITAMIN PLUS PO) Take by mouth.   Yes [provider]  NAMZARIC 28-10 MG CP24 TAKE 1 TABLET BY MOUTH DAILY. 06/02/16  Yes Nilda Riggs, NP  pravastatin (PRAVACHOL) 20 MG tablet Take 20 mg by mouth daily.   Yes  [provider]  Triamcinolone Acetonide (NASACORT ALLERGY 24HR NA) Place into the nose.   Yes [provider]    ROS:  Out of a complete 14 system review of symptoms, the patient complains only of the following symptoms, and all other reviewed systems are negative.  Cold intolerance Environmental allergies Memory loss, speech difficulty Confusion  Blood pressure 107/71, pulse 61, height 5\' 8"  (1.727 m), weight 150 lb 8 oz (68.3 kg).  Physical Exam  General: The patient is alert and cooperative at the time of the examination.  Skin: No significant peripheral edema is noted.   Neurologic Exam  Mental status: The patient is alert and oriented x 1 at the time of the  examination (the patient is not oriented to date or place). The Mini-Mental Status Examination done today shows a total score of 12/30.   Cranial nerves: Facial symmetry is present. Speech is normal, no aphasia or dysarthria is noted. Extraocular movements are full. Visual fields are full.  Motor: The patient has good strength in all 4 extremities.  Sensory examination: Soft touch sensation is symmetric on the face, arms, and legs.  Coordination: The patient has good finger-nose-finger and heel-to-shin bilaterally. The patient does have some apraxia particularly with the lower extremities.  Gait and station: The patient has a normal gait. Romberg is negative. No drift is seen.  Reflexes: Deep tendon reflexes are symmetric.   Assessment/Plan:  1. Progressive memory disturbance, Alzheimer's disease  The patient will continue the Namzaric, will follow-up in 8 or 9 months. The patient continues to progress slowly. The husband indicates that he no longer leaves her by herself at all during the day. There has been no real issue with aggression, hallucinations, or agitation.  Marlan Palau. Keith Gaia Gullikson MD 06/29/2016 1:35 PM  [facility] [address] [place], [place] [address]  Phone [phone] Fax [phone]

## 2016-08-05 ENCOUNTER — Ambulatory Visit: Payer: Medicare Other | Admitting: Neurology

## 2016-11-28 ENCOUNTER — Other Ambulatory Visit: Payer: Self-pay | Admitting: Nurse Practitioner

## 2017-03-01 ENCOUNTER — Ambulatory Visit: Payer: Medicare Other | Admitting: Adult Health

## 2017-03-01 ENCOUNTER — Encounter: Payer: Self-pay | Admitting: Adult Health

## 2017-03-01 VITALS — BP 142/72 | HR 63 | Ht 68.0 in | Wt 153.8 lb

## 2017-03-01 DIAGNOSIS — R413 Other amnesia: Secondary | ICD-10-CM

## 2017-03-01 NOTE — Progress Notes (Signed)
I have read the note, and I agree with the clinical assessment and plan.  Destiny Lopez   

## 2017-03-01 NOTE — Progress Notes (Signed)
PATIENT: Destiny Lopez DOB: 02/03/1950  REASON FOR VISIT: follow up HISTORY FROM: patient  HISTORY OF PRESENT ILLNESS: Today 03/01/17   Destiny Lopez is a 67 year old female with a history of progressive memory disorder consistent with Alzheimer's disease. She returns for follow-up.  Her husband reports that there has been a gradual decline in her memory over time.  She lives at home with her husband.  She is able to complete most ADLs independently but she does need prompting.  She no longer operates a motor vehicle.  Her husband does most of the cooking however they eat eat out a lot.  Denies any trouble sleeping.  Denies agitation.  Denies hallucinations.  He reports that he does note that she sometimes talks in her sleep.  They also have a son that lives with them and helps with her care.  The patient remains on namzaric.  Returns today for an evaluation.  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 The patient is on Namzaric, she is tolerating the medication well. She is not having any weight loss issues, she eats well. The patient sleeps well at night. She continues to have a decline in her cognitive status, her verbal capacity is declining. The patient will watch TV or create bead jewelry. The patient does not read much at this time. She requires assistance with figuring out how to use the microwave oven or the remote on the TV. She is able to feed her self, bathe, and dress her self. She does not operate a motor vehicle. She returns for an evaluation. No other new medical issues have come up since last seen.   REVIEW OF SYSTEMS: Out of a complete 14 system review of symptoms, the patient complains only of the following symptoms, and all other reviewed systems are negative.  See HPI  ALLERGIES: Allergies  Allergen Reactions  . Dexlansoprazole Other (See Comments)  . Sodium  Pentobarbital [Pentobarbital Sodium] Nausea And Vomiting    HOME MEDICATIONS: Outpatient Medications Prior to Visit  Medication Sig Dispense Refill  . Acetaminophen 650 MG TABS Take 2 tablets by mouth every 8 (eight) hours.     . Calcium Citrate (CITRACAL PO) Take by mouth. '600mg'$  tabs   Takes 2 tabs daily    . cetirizine (ZYRTEC) 10 MG tablet Take 10 mg by mouth daily.    Marland Kitchen Dextromethorphan-Quinidine (NUEDEXTA) 20-10 MG CAPS Take 1 capsule by mouth 2 (two) times daily.     Marland Kitchen KLOR-CON M20 20 MEQ tablet Take 20 mEq by mouth daily.    Marland Kitchen LORazepam (ATIVAN) 0.5 MG tablet Take 0.5 mg by mouth 3 (three) times daily.     . meloxicam (MOBIC) 7.5 MG tablet TAKE ONE TABLET (7.5 MG TOTAL) BY MOUTH DAILY.    . mirtazapine (REMERON) 30 MG tablet Take 30 mg by mouth at bedtime.    . Multiple Vitamins-Minerals (WOMENS MULTIVITAMIN PLUS PO) Take by mouth.    Marland Kitchen NAMZARIC 28-10 MG CP24 TAKE 1 CAPSULE BY MOUTH EVERY DAY 90 capsule 1  . pravastatin (PRAVACHOL) 20 MG tablet Take 20 mg by mouth daily.    . Triamcinolone Acetonide (NASACORT ALLERGY 24HR NA) Place into the nose.    . Melatonin 3 MG TABS Take 1 tablet by mouth at bedtime.     No facility-administered medications prior to visit.     PAST MEDICAL HISTORY: Past Medical History:  Diagnosis Date  .  Chest pain, unspecified   . Dysthymic disorder   . Irritable bowel syndrome   . Memory disorder 04/10/2013  . Memory loss   . Other malaise and fatigue   . Pure hypercholesterolemia   . Shortness of breath   . Unspecified essential hypertension     PAST SURGICAL HISTORY: Past Surgical History:  Procedure Laterality Date  . APPENDECTOMY    . CHOLECYSTECTOMY    . TONSILLECTOMY      FAMILY HISTORY: Family History  Problem Relation Age of Onset  . Coronary artery disease Father 39       Died MI age 15  . Alzheimer's disease Mother        Pacemaker  . Coronary artery disease Brother 40       Heart attacks in his 36s    SOCIAL  HISTORY: Social History   Socioeconomic History  . Marital status: Married    Spouse name: Destiny Lopez  . Number of children: 1  . Years of education: college  . Highest education level: Not on file  Social Needs  . Financial resource strain: Not on file  . Food insecurity - worry: Not on file  . Food insecurity - inability: Not on file  . Transportation needs - medical: Not on file  . Transportation needs - non-medical: Not on file  Occupational History  . Occupation: RETIRED  Tobacco Use  . Smoking status: Never Smoker  . Smokeless tobacco: Never Used  Substance and Sexual Activity  . Alcohol use: No  . Drug use: No  . Sexual activity: Not on file  Other Topics Concern  . Not on file  Social History Narrative   Patient lives at home with her husband Destiny Lopez)   Education college.   Right handed.   Caffeine un sweet tea.      PHYSICAL EXAM  Vitals:   03/01/17 1410  BP: (!) 142/72  Pulse: 63  Weight: 153 lb 12.8 oz (69.8 kg)  Height: '5\' 8"'$  (1.727 m)   Body mass index is 23.39 kg/m.   MMSE - Mini Mental State Exam 06/29/2016 02/03/2016 07/31/2015  Orientation to time 0 0 1  Orientation to Place 0 1 0  Registration '3 3 3  '$ Attention/ Calculation '2 3 2  '$ Recall 0 0 1  Language- name 2 objects '2 2 2  '$ Language- repeat 1 0 0  Language- follow 3 step command '2 3 3  '$ Language- read & follow direction '1 1 1  '$ Write a sentence 0 1 0  Copy design '1 1 1  '$ Total score '12 15 14     '$ Generalized: Well developed, in no acute distress   Neurological examination  Mentation: Alert oriented to time, place, history taking. Follows all commands speech and language fluent Cranial nerve II-XII: Pupils were equal round reactive to light. Extraocular movements were full, visual field were full on confrontational test. Facial sensation and strength were normal. Uvula tongue midline. Head turning and shoulder shrug  were normal and symmetric. Motor: The motor testing reveals 5 over 5  strength of all 4 extremities. Good symmetric motor tone is noted throughout.  Sensory: Sensory testing is intact to soft touch on all 4 extremities. No evidence of extinction is noted.  Coordination: Cerebellar testing reveals good finger-nose-finger and heel-to-shin bilaterally.  Gait and station: Gait is normal. Tandem gait is normal. Romberg is negative. No drift is seen.  Reflexes: Deep tendon reflexes are symmetric and normal bilaterally.   DIAGNOSTIC DATA (LABS, IMAGING, TESTING) -  I reviewed patient records, labs, notes, testing and imaging myself where available.  Lab Results  Component Value Date   WBC 5.1 06/06/2010   HGB 12.2 06/06/2010   HCT 38.5 06/06/2010   MCV 76.8 (L) 06/06/2010   PLT 250 06/06/2010      Component Value Date/Time   NA 140 06/06/2010 1500   NA 140 06/06/2010 1500   NA 140 06/06/2010 1500   K 4.3 06/06/2010 1500   K 4.3 06/06/2010 1500   K 4.3 06/06/2010 1500   CL 103 06/06/2010 1500   CL 103 06/06/2010 1500   CL 103 06/06/2010 1500   CO2 28 06/06/2010 1500   CO2 28 06/06/2010 1500   CO2 28 06/06/2010 1500   GLUCOSE 86 06/06/2010 1500   GLUCOSE 86 06/06/2010 1500   GLUCOSE 86 06/06/2010 1500   BUN 10 06/06/2010 1500   BUN 10 06/06/2010 1500   BUN 10 06/06/2010 1500   CREATININE 0.88 06/06/2010 1500   CREATININE 0.88 06/06/2010 1500   CREATININE 0.88 06/06/2010 1500   CALCIUM 9.4 06/06/2010 1500   CALCIUM 9.4 06/06/2010 1500   CALCIUM 9.4 06/06/2010 1500   PROT 7.2 06/06/2010 1500   PROT 7.2 06/06/2010 1500   PROT 7.2 06/06/2010 1500   ALBUMIN 4.1 06/06/2010 1500   ALBUMIN 4.1 06/06/2010 1500   ALBUMIN 4.1 06/06/2010 1500   AST 17 06/06/2010 1500   AST 17 06/06/2010 1500   AST 17 06/06/2010 1500   ALT 13 06/06/2010 1500   ALT 13 06/06/2010 1500   ALT 13 06/06/2010 1500   ALKPHOS 71 06/06/2010 1500   ALKPHOS 71 06/06/2010 1500   ALKPHOS 71 06/06/2010 1500   BILITOT 0.6 06/06/2010 1500   BILITOT 0.6 06/06/2010 1500   BILITOT 0.6  06/06/2010 1500   GFRNONAA >60 06/06/2010 1500   GFRNONAA >60 06/06/2010 1500   GFRNONAA >60 06/06/2010 1500   GFRAA  06/06/2010 1500    >60        The eGFR has been calculated using the MDRD equation. This calculation has not been validated in all clinical situations. eGFR's persistently <60 mL/min signify possible Chronic Kidney Disease.   GFRAA  06/06/2010 1500    >60        The eGFR has been calculated using the MDRD equation. This calculation has not been validated in all clinical situations. eGFR's persistently <60 mL/min signify possible Chronic Kidney Disease.   GFRAA  06/06/2010 1500    >60        The eGFR has been calculated using the MDRD equation. This calculation has not been validated in all clinical situations. eGFR's persistently <60 mL/min signify possible Chronic Kidney Disease.      ASSESSMENT AND PLAN 67 y.o. year old female  has a past medical history of Chest pain, unspecified, Dysthymic disorder, Irritable bowel syndrome, Memory disorder (04/10/2013), Memory loss, Other malaise and fatigue, Pure hypercholesterolemia, Shortness of breath, and Unspecified essential hypertension. here with:  1.  Memory disturbance  We were unable to complete memory testing today.  The patient's memory for the last visit was 12 out of 35.  She will continue on Namzaric.  I have advised that if her symptoms worsen or she develops new symptoms she should let us know.  She will follow-up in 6 months or sooner if needed.  I spent 15 minutes with the patient. 50% of this time was spent Reviewing disease progression and medication.     Ward Givens, MSN, NP-C 03/01/2017, 2:22 PM  [facility] [address], [address] [place], [place] [address] [phone]   

## 2017-03-01 NOTE — Patient Instructions (Addendum)
Your Plan:  Continue Namzaric If your symptoms worsen or you develop new symptoms please let us know.    Thank you for coming to see us at [facility]. I hope we have been able to provide you high quality care today.  You may receive a patient satisfaction survey over the next few weeks. We would appreciate your feedback and comments so that we may continue to improve ourselves and the health of our patients.  

## 2017-05-22 ENCOUNTER — Other Ambulatory Visit: Payer: Self-pay | Admitting: Neurology

## 2017-05-25 ENCOUNTER — Other Ambulatory Visit: Payer: Self-pay | Admitting: Neurology

## 2017-09-07 ENCOUNTER — Ambulatory Visit: Payer: Medicare Other | Admitting: Adult Health

## 2017-09-07 ENCOUNTER — Encounter: Payer: Self-pay | Admitting: Adult Health

## 2017-09-07 VITALS — BP 111/70 | HR 60 | Ht 68.0 in | Wt 149.8 lb

## 2017-09-07 DIAGNOSIS — R413 Other amnesia: Secondary | ICD-10-CM

## 2017-09-07 NOTE — Progress Notes (Signed)
I have read the note, and I agree with the clinical assessment and plan.  Emylee Decelle K Lariza Cothron   

## 2017-09-07 NOTE — Patient Instructions (Signed)
Your Plan:  Continue to monitor memory  Continue Namzaric If your symptoms worsen or you develop new symptoms please let us know.   [facility]. I hope we have been able to provide you high quality care today.  You may receive a patient satisfaction survey over the next few weeks. We would appreciate your feedback and comments so that we may continue to improve ourselves and the health of our patients.

## 2017-09-07 NOTE — Progress Notes (Signed)
Destiny Lopez: Destiny Lopez DOB: [birth year]  REASON FOR VISIT: follow up HISTORY FROM: Destiny Lopez  HISTORY OF PRESENT ILLNESS: Today 09/07/17: Destiny Lopez is a 67 year old female with a history of memory disturbance.  Destiny Lopez returns today for follow-up.  Destiny Lopez continues to live at home with Destiny Lopez husband.  Destiny Lopez is able to complete ADLs but does need prompting.  Destiny Lopez is able to feed herself.  They primarily eat frozen dinners.  Destiny Lopez is unable to use a microwave.  Husband denies any significant agitation or aggressiveness.  He does report occasional bowel and bladder incontinence.  Destiny Lopez continues on Namzaric.  Destiny Lopez returns today for evaluation.  HISTORY 03/01/17   Destiny Lopez is a 67 year old female with a history of progressive memory disorder consistent with Alzheimer's disease. Destiny Lopez returns for follow-up.  Destiny Lopez husband reports that there has been a gradual decline in Destiny Lopez memory over time.  Destiny Lopez lives at home with Destiny Lopez husband.  Destiny Lopez is able to complete most ADLs independently but Destiny Lopez does need prompting.  Destiny Lopez no longer operates a motor vehicle.  Destiny Lopez husband does most of the cooking however they eat eat out a lot.  Denies any trouble sleeping.  Denies agitation.  Denies hallucinations.  He reports that he does note that Destiny Lopez sometimes talks in Destiny Lopez sleep.  They also have a son that lives with them and helps with Destiny Lopez care.  The Destiny Lopez remains on namzaric.  Returns today for an evaluation.  REVIEW OF SYSTEMS: Out of a complete 14 system review of symptoms, the Destiny Lopez complains only of the following symptoms, and all other reviewed systems are negative.  See HPI  ALLERGIES: Allergies  Allergen Reactions  . Dexlansoprazole Other (See Comments)  . Sodium Pentobarbital [Pentobarbital Sodium] Nausea And Vomiting    HOME MEDICATIONS: Outpatient Medications Prior to Visit  Medication Sig Dispense Refill  . Acetaminophen 650 MG TABS Take 2 tablets by mouth every 8 (eight) hours.     . Calcium Citrate (CITRACAL PO) Take  by mouth. 647m tabs   Takes 2 tabs daily    . cetirizine (ZYRTEC) 10 MG tablet Take 10 mg by mouth daily.    .Marland KitchenDextromethorphan-Quinidine (NUEDEXTA) 20-10 MG CAPS Take 1 capsule by mouth 2 (two) times daily.     .Marland KitchenKLOR-CON M20 20 MEQ tablet Take 20 mEq by mouth daily.    .Marland KitchenLORazepam (ATIVAN) 0.5 MG tablet Take 0.5 mg by mouth 3 (three) times daily.     . Melatonin 5 MG CAPS Take by mouth. Takes one cap at bedtime.    . meloxicam (MOBIC) 7.5 MG tablet TAKE ONE TABLET (7.5 MG TOTAL) BY MOUTH DAILY.    . mirtazapine (REMERON) 30 MG tablet Take 30 mg by mouth at bedtime.    . Multiple Vitamins-Minerals (WOMENS MULTIVITAMIN PLUS PO) Take by mouth.    .Marland KitchenNAMZARIC 28-10 MG CP24 TAKE 1 CAPSULE BY MOUTH EVERY DAY 90 capsule 1  . pravastatin (PRAVACHOL) 20 MG tablet Take 20 mg by mouth daily.    . Triamcinolone Acetonide (NASACORT ALLERGY 24HR NA) Place into the nose.     No facility-administered medications prior to visit.     PAST MEDICAL HISTORY: Past Medical History:  Diagnosis Date  . Chest pain, unspecified   . Dysthymic disorder   . Irritable bowel syndrome   . Memory disorder 04/10/2013  . Memory loss   . Other malaise and fatigue   . Pure hypercholesterolemia   . Shortness of breath   .  Unspecified essential hypertension     PAST SURGICAL HISTORY: Past Surgical History:  Procedure Laterality Date  . APPENDECTOMY    . CHOLECYSTECTOMY    . TONSILLECTOMY      FAMILY HISTORY: Family History  Problem Relation Age of Onset  . Coronary artery disease Father 69       Died MI age 53  . Alzheimer's disease Mother        Pacemaker  . Coronary artery disease Brother 40       Heart attacks in his 3s    SOCIAL HISTORY: Social History   Socioeconomic History  . Marital status: Married    Spouse name: [name]  . Number of children: 1  . Years of education: college  . Highest education level: Not on file  Occupational History  . Occupation: RETIRED  Social Needs  .  Financial resource strain: Not on file  . Food insecurity:    Worry: Not on file    Inability: Not on file  . Transportation needs:    Medical: Not on file    Non-medical: Not on file  Tobacco Use  . Smoking status: Never Smoker  . Smokeless tobacco: Never Used  Substance and Sexual Activity  . Alcohol use: No  . Drug use: No  . Sexual activity: Not on file  Lifestyle  . Physical activity:    Days per week: Not on file    Minutes per session: Not on file  . Stress: Not on file  Relationships  . Social connections:    Talks on phone: Not on file    Gets together: Not on file    Attends religious service: Not on file    Active member of club or organization: Not on file    Attends meetings of clubs or organizations: Not on file    Relationship status: Not on file  . Intimate partner violence:    Fear of current or ex partner: Not on file    Emotionally abused: Not on file    Physically abused: Not on file    Forced sexual activity: Not on file  Other Topics Concern  . Not on file  Social History Narrative   Destiny Lopez lives at home with Destiny Lopez husband [name])   Education college.   Right handed.   Caffeine un sweet tea.      PHYSICAL EXAM  Vitals:   09/07/17 1354  BP: 111/70  Pulse: 60  Weight: 149 lb 12.8 oz (67.9 kg)  Height: '5\' 8"'  (1.727 m)   Body mass index is 22.78 kg/m.   MMSE - Mini Mental State Exam 06/29/2016 02/03/2016 07/31/2015  Orientation to time 0 0 1  Orientation to Place 0 1 0  Registration '3 3 3  ' Attention/ Calculation '2 3 2  ' Recall 0 0 1  Language- name 2 objects '2 2 2  ' Language- repeat 1 0 0  Language- follow 3 step command '2 3 3  ' Language- read & follow direction '1 1 1  ' Write a sentence 0 1 0  Copy design '1 1 1  ' Total score '12 15 14     ' Generalized: Well developed, in no acute distress   Neurological examination  Mentation: Alert  Follows all commands speech and language fluent Cranial nerve II-XII:  Extraocular movements were  full, visual field were full on confrontational test. Facial sensation and strength were normal. Uvula tongue midline. Head turning and shoulder shrug  were normal and symmetric. Motor: The motor testing reveals 5  over 5 strength of all 4 extremities. Good symmetric motor tone is noted throughout.  Sensory: Sensory testing is intact to soft touch on all 4 extremities. No evidence of extinction is noted.  Coordination: Cerebellar testing reveals good finger-nose-finger and heel-to-shin bilaterally. Difficulty with directions Gait and station: Gait is normal.  Reflexes: Deep tendon reflexes are symmetric and normal bilaterally.   DIAGNOSTIC DATA (LABS, IMAGING, TESTING) - I reviewed Destiny Lopez records, labs, notes, testing and imaging myself where available.  Lab Results  Component Value Date   WBC 5.1 06/06/2010   HGB 12.2 06/06/2010   HCT 38.5 06/06/2010   MCV 76.8 (L) 06/06/2010   PLT 250 06/06/2010      Component Value Date/Time   NA 140 06/06/2010 1500   NA 140 06/06/2010 1500   NA 140 06/06/2010 1500   K 4.3 06/06/2010 1500   K 4.3 06/06/2010 1500   K 4.3 06/06/2010 1500   CL 103 06/06/2010 1500   CL 103 06/06/2010 1500   CL 103 06/06/2010 1500   CO2 28 06/06/2010 1500   CO2 28 06/06/2010 1500   CO2 28 06/06/2010 1500   GLUCOSE 86 06/06/2010 1500   GLUCOSE 86 06/06/2010 1500   GLUCOSE 86 06/06/2010 1500   BUN 10 06/06/2010 1500   BUN 10 06/06/2010 1500   BUN 10 06/06/2010 1500   CREATININE 0.88 06/06/2010 1500   CREATININE 0.88 06/06/2010 1500   CREATININE 0.88 06/06/2010 1500   CALCIUM 9.4 06/06/2010 1500   CALCIUM 9.4 06/06/2010 1500   CALCIUM 9.4 06/06/2010 1500   PROT 7.2 06/06/2010 1500   PROT 7.2 06/06/2010 1500   PROT 7.2 06/06/2010 1500   ALBUMIN 4.1 06/06/2010 1500   ALBUMIN 4.1 06/06/2010 1500   ALBUMIN 4.1 06/06/2010 1500   AST 17 06/06/2010 1500   AST 17 06/06/2010 1500   AST 17 06/06/2010 1500   ALT 13 06/06/2010 1500   ALT 13 06/06/2010 1500   ALT  13 06/06/2010 1500   ALKPHOS 71 06/06/2010 1500   ALKPHOS 71 06/06/2010 1500   ALKPHOS 71 06/06/2010 1500   BILITOT 0.6 06/06/2010 1500   BILITOT 0.6 06/06/2010 1500   BILITOT 0.6 06/06/2010 1500   GFRNONAA >60 06/06/2010 1500   GFRNONAA >60 06/06/2010 1500   GFRNONAA >60 06/06/2010 1500   GFRAA  06/06/2010 1500    >60        The eGFR has been calculated using the MDRD equation. This calculation has not been validated in all clinical situations. eGFR's persistently <60 mL/min signify possible Chronic Kidney Disease.   GFRAA  06/06/2010 1500    >60        The eGFR has been calculated using the MDRD equation. This calculation has not been validated in all clinical situations. eGFR's persistently <60 mL/min signify possible Chronic Kidney Disease.   GFRAA  06/06/2010 1500    >60        The eGFR has been calculated using the MDRD equation. This calculation has not been validated in all clinical situations. eGFR's persistently <60 mL/min signify possible Chronic Kidney Disease.      ASSESSMENT AND PLAN 67 y.o. year old female  has a past medical history of Chest pain, unspecified, Dysthymic disorder, Irritable bowel syndrome, Memory disorder (04/10/2013), Memory loss, Other malaise and fatigue, Pure hypercholesterolemia, Shortness of breath, and Unspecified essential hypertension. here with:  1.  Alzheimer's disease  The Destiny Lopez was unable to complete a memory test today.  Destiny Lopez will continue on the Namzaric.  I have advised the Destiny Lopez and Destiny Lopez husband have Destiny Lopez symptoms change or Destiny Lopez develops new symptoms Destiny Lopez should let us know.  We will follow-up in 6 months or sooner if needed.   I spent 15 minutes with the Destiny Lopez. 50% of this time was spent reviewing plan of care and disease progression   Ward Givens, MSN, NP-C 09/07/2017, 2:15 PM [facility] [address] [place],  [address] [phone]

## 2017-11-08 ENCOUNTER — Other Ambulatory Visit: Payer: Self-pay | Admitting: Adult Health

## 2017-11-23 ENCOUNTER — Telehealth: Payer: Self-pay | Admitting: *Deleted

## 2017-11-23 NOTE — Telephone Encounter (Signed)
I called the patient's husband.  He reports that her memory score has declined since the last visit.  They saw the primary care who ordered a urinalysis.  He has been unable to get a sample.  At the last visit we were unable to complete a MMSE.  I did explain to the patient that this could be a natural progression of the disease process.  However reversible causes such as an infection should be considered.  The husband reports that he wants to wait until he is able to get a urine sample before he makes an appointment with our office.

## 2017-11-23 NOTE — Telephone Encounter (Signed)
This RN received referral from patient's PCP requesting her to be seen for increased memory issues. PCP wants her seen sooner than current FU in March 2020. Called only number available, no answer and voice mailbox full. Per NP, patient can be scheduled with soonest available and put on wait list.  Will try to reach later.

## 2017-11-23 NOTE — Telephone Encounter (Signed)
Spoke with patient's husband who stated he would prefer to talk to the NP. He stated he wanted to give her details of what's been happening. This RN requested he give her details, but he stated he preferred to talk with NP. He stated that he would discuss it with her, and they would decide if his wife needed to be seen sooner. This RN was unable to assist him further.  This RN did advise him that if she needs to be seen sooner, we will reschedule to soonest available, and put her on a wait list. However advised him follow ups are booking into next year.  He verbalized understanding. He also requested the home phone number be used first as that phone has caller ID. The change was made in demographics.

## 2018-01-18 ENCOUNTER — Ambulatory Visit: Payer: Medicare Other | Admitting: Neurology

## 2018-01-18 ENCOUNTER — Encounter: Payer: Self-pay | Admitting: Neurology

## 2018-01-18 DIAGNOSIS — F0280 Dementia in other diseases classified elsewhere without behavioral disturbance: Secondary | ICD-10-CM

## 2018-01-18 DIAGNOSIS — G300 Alzheimer's disease with early onset: Secondary | ICD-10-CM | POA: Diagnosis not present

## 2018-01-18 DIAGNOSIS — G309 Alzheimer's disease, unspecified: Secondary | ICD-10-CM

## 2018-01-18 HISTORY — DX: Dementia in other diseases classified elsewhere, unspecified severity, without behavioral disturbance, psychotic disturbance, mood disturbance, and anxiety: F02.80

## 2018-01-18 MED ORDER — MIRTAZAPINE 45 MG PO TABS: 45.0000 mg | ORAL_TABLET | Freq: Every day | ORAL | 1 refills | Status: DC

## 2018-01-18 MED ORDER — MEMANTINE HCL ER 28 MG PO CP24: 28.0000 mg | ORAL_CAPSULE | Freq: Every day | ORAL | 3 refills | Status: DC

## 2018-01-18 NOTE — Progress Notes (Signed)
Reason for visit: Memory disorder   Destiny Lopez is an 68 y.o. female  History of present illness:  Destiny Lopez is a 68 year old right handed female with a history of progressive memory disorder consistent with Alzheimer's Disease. The patient is currently taking Namzaric, and is tolerating the medication well. She resides at home with husband who is her caregiver. History is provided by her husband, who reports her memory has progressed since last visit, and he has noticed her lack of concentration and him having to repeat himself when talking to her. Often she doesn't realize that he is talking to her. He has been preparing her medications but lately she has been forgetting to take them and he is having to prompt her. She has trouble swallowing so she takes her morning medications with yogurt, and evening with apple sauce. He is having to check the trash to see if the yogurt or apple sauce container is in the trash as an indicator whether she has taken her medications or not. Fortunately, she has not had any falls and does not use any assistive devices, however she does have a slow gait. A big issue is her urinary and bowel incontinence. She is having to wear incontinence underwear all the time. She notices when her underwear are wet or soiled and she goes to the bathroom to put on a clean pair. Hygiene is a big concern for her husband, as often she doesn't wash her hands after toileting or she will put back on soiled clothing. Several times a week he will find her wandering around the bedroom at night. Once, she was found by the door trying to unlock the door. Another time, he found her at the bookcase in their bedroom and he wonders if she may have been hallucinating. She hasn't had any agitation towards her husband, but sometimes gets frustrated at him. Her appetite is good. She makes a sandwich for herself, but her husband helps with warming meals in the evening, as she does not use the  microwave. She does dress herself. She does takes a shower with her husband and does most of her bathing herself. Overall, her memory has progressed and of greatest concern is her wandering during the night, and incontinence. She is making necklaces daily, and this serves as a productive outlet for the patient. Her husband is her sole caregiver.   ROS: Cold intolerance, black stools, incontinence of bowels/bladder, frequent urination, memory loss, speech difficulty, agitation, confusion, decreased concentration, sleep walking, sleep talking, acting out dreams  Past Medical History:  Diagnosis Date  . Alzheimer disease (HCC) 01/18/2018  . Chest pain, unspecified   . Dysthymic disorder   . Irritable bowel syndrome   . Memory disorder 04/10/2013  . Memory loss   . Other malaise and fatigue   . Pure hypercholesterolemia   . Shortness of breath   . Unspecified essential hypertension     Past Surgical History:  Procedure Laterality Date  . APPENDECTOMY    . CHOLECYSTECTOMY    . TONSILLECTOMY      Family History  Problem Relation Age of Onset  . Coronary artery disease Father [id number]       Died MI age 70  . Alzheimer's disease Mother        Pacemaker  . Coronary artery disease Brother 40       Heart attacks in his 4640s    Social history:  reports that she has never smoked. She has never used  smokeless tobacco. She reports that she does not drink alcohol or use drugs.    Allergies  Allergen Reactions  . Dexlansoprazole Other (See Comments)  . Sodium Pentobarbital [Pentobarbital Sodium] Nausea And Vomiting    Medications:  Prior to Admission medications   Medication Sig Start Date End Date Taking? Authorizing Provider  Acetaminophen 650 MG TABS Take 2 tablets by mouth every 8 (eight) hours.     [provider]  Calcium Citrate (CITRACAL PO) Take by mouth. 600mg  tabs   Takes 2 tabs daily    [provider]  cetirizine (ZYRTEC) 10 MG tablet Take 10 mg by mouth daily.     [provider]  Dextromethorphan-Quinidine (NUEDEXTA) 20-10 MG CAPS Take 1 capsule by mouth 2 (two) times daily.  05/19/14   [provider]  KLOR-CON M20 20 MEQ tablet Take 20 mEq by mouth daily. 04/05/13   [provider]  LORazepam (ATIVAN) 0.5 MG tablet Take 0.5 mg by mouth 3 (three) times daily.     [provider]  Melatonin 5 MG CAPS Take by mouth. Takes one cap at bedtime.    [provider]  meloxicam (MOBIC) 7.5 MG tablet TAKE ONE TABLET (7.5 MG TOTAL) BY MOUTH DAILY. 08/29/14   [provider]  mirtazapine (REMERON) 30 MG tablet Take 30 mg by mouth at bedtime.    [provider]  Multiple Vitamins-Minerals (WOMENS MULTIVITAMIN PLUS PO) Take by mouth.    [provider]  NAMZARIC 28-10 MG CP24 TAKE 1 CAPSULE BY MOUTH EVERY DAY 11/08/17   Butch Penny, NP  pravastatin (PRAVACHOL) 20 MG tablet Take 20 mg by mouth daily.    [provider]  Triamcinolone Acetonide (NASACORT ALLERGY 24HR NA) Place into the nose.    [provider]    Blood pressure 136/82, pulse 65, height 5\' 8"  (1.727 m), weight 153 lb (69.4 kg).  Physical Exam  General: The patient is alert and cooperative at the time of the examination.  Skin: No significant peripheral edema is noted.   Neurologic Exam  Mental status: The patient is alert and cooperative, the patient is oriented to her name, she recognizes her husband, she is not oriented to date.   Cranial nerves: Facial symmetry is present. Speech is normal, no aphasia or dysarthria is noted. Extraocular movements are full. Visual fields are full to threat to the digit testing.  Motor: The patient has good strength in all 4 extremities.  Sensory examination: Not tested.  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  Gait and station: The patient has a normal gait.  Romberg is  negative.  Reflexes: Deep tendon reflexes are symmetric.    Assessment/Plan:  1. Alzheimer's Disease  We will stop the Namzaric due to the potential of urinary frequency, loose stools as result of aricept. She should start taking Namenda 28 mg daily. We will increase Remeron to 45 mg at bedtime in hopes of improved sleep and to prevent nighttime wandering. Furthermore, we discussed at length the disease process of AD and how unfortunately the symptoms are going to progress. We discussed the potential for caregiver burnout, and the need to explore options for in home assistance or daycare. Also, we discussed implementing scheduled tolieting to potentially help with incontinence. The patient will follow-up in 6 months or sooner if concerns arise.    York Spaniel

## 2018-02-16 ENCOUNTER — Telehealth: Payer: Self-pay | Admitting: Neurology

## 2018-02-16 MED ORDER — MEMANTINE HCL-DONEPEZIL HCL ER 28-10 MG PO CP24: 1.0000 | ORAL_CAPSULE | Freq: Every day | ORAL | 1 refills | Status: DC

## 2018-02-16 NOTE — Telephone Encounter (Signed)
I called the husband.  The patient has done less well cognitively off of Namzeric, she is still having incontinence, but now she is having increasing problems with understanding what to do when she is incontinent, she will have difficulty changing her underwear, or realizing that she has had incontinence.  We will switch back to the Namzeric.  A prescription was sent in.  I have once again indicated the husband needs to aggressively seek out a memory disorders unit, the patient will need to make a transition to an extended care facility in the near future.

## 2018-02-16 NOTE — Telephone Encounter (Signed)
Pt's husband is requesting a call back stating that his wife's behavior has worsen since the last time she was seen. Pt was switched to memantine (NAMENDA XR) 28 MG CP24 24 hr capsule and this is when the husband noticed that the behavior and incontinence began to get worse. Please advise.

## 2018-03-24 ENCOUNTER — Ambulatory Visit: Payer: Medicare Other | Admitting: Neurology

## 2018-07-11 ENCOUNTER — Other Ambulatory Visit: Payer: Self-pay | Admitting: Neurology

## 2018-07-19 ENCOUNTER — Ambulatory Visit: Payer: Medicare Other | Admitting: Neurology

## 2018-08-23 ENCOUNTER — Ambulatory Visit: Payer: Medicare Other | Admitting: Neurology

## 2018-11-05 ENCOUNTER — Other Ambulatory Visit: Payer: Self-pay | Admitting: Neurology

## 2018-11-30 ENCOUNTER — Ambulatory Visit: Payer: Medicare Other | Admitting: Neurology

## 2019-05-09 ENCOUNTER — Other Ambulatory Visit: Payer: Self-pay | Admitting: Neurology

## 2019-06-11 ENCOUNTER — Other Ambulatory Visit: Payer: Self-pay | Admitting: Neurology

## 2019-07-09 ENCOUNTER — Other Ambulatory Visit: Payer: Self-pay | Admitting: Neurology

## 2019-08-09 ENCOUNTER — Telehealth: Payer: Self-pay | Admitting: Neurology

## 2019-08-09 NOTE — Telephone Encounter (Signed)
LVM advising pt that 07/29 appointment had been cancelled due to NP being out. Provided office # requesting cb to reschedule.

## 2019-08-10 ENCOUNTER — Ambulatory Visit: Payer: Medicare Other | Admitting: Neurology

## 2019-12-27 ENCOUNTER — Ambulatory Visit: Payer: Medicare PPO | Admitting: Neurology

## 2019-12-27 ENCOUNTER — Other Ambulatory Visit: Payer: Self-pay | Admitting: Neurology

## 2020-01-25 ENCOUNTER — Other Ambulatory Visit: Payer: Self-pay | Admitting: Neurology

## 2020-02-18 ENCOUNTER — Emergency Department (HOSPITAL_COMMUNITY): Payer: Medicare PPO

## 2020-02-18 ENCOUNTER — Encounter (HOSPITAL_COMMUNITY): Payer: Self-pay | Admitting: Emergency Medicine

## 2020-02-18 ENCOUNTER — Other Ambulatory Visit: Payer: Self-pay

## 2020-02-18 ENCOUNTER — Inpatient Hospital Stay (HOSPITAL_COMMUNITY)
Admission: EM | Admit: 2020-02-18 | Discharge: 2020-02-20 | DRG: 482 | Disposition: A | Discharge status: Skilled Nursing Facility | Payer: Medicare PPO | Attending: Family Medicine | Admitting: Family Medicine

## 2020-02-18 DIAGNOSIS — T148XXA Other injury of unspecified body region, initial encounter: Secondary | ICD-10-CM

## 2020-02-18 DIAGNOSIS — I10 Essential (primary) hypertension: Secondary | ICD-10-CM | POA: Diagnosis present

## 2020-02-18 DIAGNOSIS — S72002A Fracture of unspecified part of neck of left femur, initial encounter for closed fracture: Secondary | ICD-10-CM

## 2020-02-18 DIAGNOSIS — W19XXXA Unspecified fall, initial encounter: Secondary | ICD-10-CM | POA: Diagnosis not present

## 2020-02-18 DIAGNOSIS — S72145D Nondisplaced intertrochanteric fracture of left femur, subsequent encounter for closed fracture with routine healing: Secondary | ICD-10-CM | POA: Diagnosis not present

## 2020-02-18 DIAGNOSIS — Z791 Long term (current) use of non-steroidal anti-inflammatories (NSAID): Secondary | ICD-10-CM | POA: Diagnosis not present

## 2020-02-18 DIAGNOSIS — E785 Hyperlipidemia, unspecified: Secondary | ICD-10-CM | POA: Diagnosis present

## 2020-02-18 DIAGNOSIS — Z66 Do not resuscitate: Secondary | ICD-10-CM | POA: Diagnosis present

## 2020-02-18 DIAGNOSIS — W1830XA Fall on same level, unspecified, initial encounter: Secondary | ICD-10-CM | POA: Diagnosis present

## 2020-02-18 DIAGNOSIS — Z9049 Acquired absence of other specified parts of digestive tract: Secondary | ICD-10-CM | POA: Diagnosis not present

## 2020-02-18 DIAGNOSIS — F0280 Dementia in other diseases classified elsewhere without behavioral disturbance: Secondary | ICD-10-CM

## 2020-02-18 DIAGNOSIS — S72143A Displaced intertrochanteric fracture of unspecified femur, initial encounter for closed fracture: Secondary | ICD-10-CM

## 2020-02-18 DIAGNOSIS — G309 Alzheimer's disease, unspecified: Secondary | ICD-10-CM | POA: Diagnosis present

## 2020-02-18 DIAGNOSIS — W010XXA Fall on same level from slipping, tripping and stumbling without subsequent striking against object, initial encounter: Secondary | ICD-10-CM

## 2020-02-18 DIAGNOSIS — S72142A Displaced intertrochanteric fracture of left femur, initial encounter for closed fracture: Principal | ICD-10-CM

## 2020-02-18 DIAGNOSIS — Z20822 Contact with and (suspected) exposure to covid-19: Secondary | ICD-10-CM | POA: Diagnosis present

## 2020-02-18 DIAGNOSIS — R54 Age-related physical debility: Secondary | ICD-10-CM | POA: Diagnosis present

## 2020-02-18 DIAGNOSIS — Z7951 Long term (current) use of inhaled steroids: Secondary | ICD-10-CM

## 2020-02-18 DIAGNOSIS — Z820 Family history of epilepsy and other diseases of the nervous system: Secondary | ICD-10-CM

## 2020-02-18 DIAGNOSIS — Z8249 Family history of ischemic heart disease and other diseases of the circulatory system: Secondary | ICD-10-CM | POA: Diagnosis not present

## 2020-02-18 DIAGNOSIS — S72145A Nondisplaced intertrochanteric fracture of left femur, initial encounter for closed fracture: Secondary | ICD-10-CM | POA: Diagnosis not present

## 2020-02-18 DIAGNOSIS — Z888 Allergy status to other drugs, medicaments and biological substances status: Secondary | ICD-10-CM

## 2020-02-18 DIAGNOSIS — Z79899 Other long term (current) drug therapy: Secondary | ICD-10-CM

## 2020-02-18 DIAGNOSIS — G300 Alzheimer's disease with early onset: Secondary | ICD-10-CM

## 2020-02-18 LAB — CBC
HCT: 40.9 % (ref 36.0–46.0)
Hemoglobin: 13.3 g/dL (ref 12.0–15.0)
MCH: 29.7 pg (ref 26.0–34.0)
MCHC: 32.5 g/dL (ref 30.0–36.0)
MCV: 91.3 fL (ref 80.0–100.0)
Platelets: 183 10*3/uL (ref 150–400)
RBC: 4.48 MIL/uL (ref 3.87–5.11)
RDW: 13.1 % (ref 11.5–15.5)
WBC: 12.8 10*3/uL — ABNORMAL HIGH (ref 4.0–10.5)
nRBC: 0 % (ref 0.0–0.2)

## 2020-02-18 LAB — BASIC METABOLIC PANEL
Anion gap: 6 (ref 5–15)
BUN: 36 mg/dL — ABNORMAL HIGH (ref 8–23)
CO2: 23 mmol/L (ref 22–32)
Calcium: 9.4 mg/dL (ref 8.9–10.3)
Chloride: 111 mmol/L (ref 98–111)
Creatinine, Ser: 0.78 mg/dL (ref 0.44–1.00)
GFR, Estimated: >60 mL/min (ref >60)
Glucose, Bld: 149 mg/dL — ABNORMAL HIGH (ref 70–99)
Potassium: 4.4 mmol/L (ref 3.5–5.1)
Sodium: 140 mmol/L (ref 135–145)

## 2020-02-18 LAB — TYPE AND SCREEN
ABO/RH(D): O NEG
Antibody Screen: NEGATIVE

## 2020-02-18 MED ORDER — MORPHINE SULFATE (PF) 4 MG/ML IV SOLN
4.0000 mg | Freq: Once | INTRAVENOUS | Status: AC
Start: 2020-02-18 — End: 2020-02-18
  Administered 2020-02-18: 4 mg via INTRAVENOUS
  Filled 2020-02-18: qty 1

## 2020-02-18 MED ORDER — TRANEXAMIC ACID-NACL 1000-0.7 MG/100ML-% IV SOLN
1000.0000 mg | INTRAVENOUS | Status: AC
  Administered 2020-02-19: 1000 mg via INTRAVENOUS
  Filled 2020-02-18: qty 100

## 2020-02-18 MED ORDER — CEFAZOLIN SODIUM-DEXTROSE 2-4 GM/100ML-% IV SOLN
2.0000 g | INTRAVENOUS | Status: AC
  Administered 2020-02-19 (×2): 1 g via INTRAVENOUS

## 2020-02-18 MED ORDER — HYDRALAZINE HCL 20 MG/ML IJ SOLN: 10.0000 mg | Freq: Four times a day (QID) | INTRAMUSCULAR | Status: DC | PRN

## 2020-02-18 MED ORDER — ONDANSETRON HCL 4 MG/2ML IJ SOLN
4.0000 mg | Freq: Once | INTRAMUSCULAR | Status: AC
  Administered 2020-02-18: 4 mg via INTRAVENOUS
  Filled 2020-02-18: qty 2

## 2020-02-18 NOTE — H&P (Incomplete)
TRH H&P    Patient Demographics:    Destiny Lopez, is a 70 y.o. female  MRN: [id number]  DOB - [birth year]  Admit Date - 02/18/2020  Referring MD/NP/PA: Denton Lank  Outpatient Primary MD for the patient is [name], MD  Patient coming from: Home  Chief complaint- mechanical fall   HPI:    Destiny Lopez  is a 70 y.o. female,***    Review of systems:    Review of Systems  Unable to perform ROS: Dementia       Past History of the following :    Past Medical History:  Diagnosis Date  . Alzheimer disease (HCC) 01/18/2018  . Chest pain, unspecified   . Dysthymic disorder   . Irritable bowel syndrome   . Memory disorder 04/10/2013  . Memory loss   . Other malaise and fatigue   . Pure hypercholesterolemia   . Shortness of breath   . Unspecified essential hypertension       Past Surgical History:  Procedure Laterality Date  . APPENDECTOMY    . CHOLECYSTECTOMY    . TONSILLECTOMY        Social History:      Social History   Tobacco Use  . Smoking status: Never Smoker  . Smokeless tobacco: Never Used  Substance Use Topics  . Alcohol use: No       Family History :     Family History  Problem Relation Age of Onset  . Coronary artery disease Father 86       Died MI age 47  . Alzheimer's disease Mother        Pacemaker  . Coronary artery disease Brother 40       Heart attacks in his 15s   ***   Home Medications:   Prior to Admission medications   Medication Sig Start Date End Date Taking? Authorizing Provider  Acetaminophen 650 MG TABS Take 2 tablets by mouth every 8 (eight) hours.     [provider]  Calcium Citrate (CITRACAL PO) Take by mouth. 600mg  tabs   Takes 2 tabs daily    [provider]  cetirizine (ZYRTEC) 10 MG tablet Take 10 mg by mouth daily.    [provider]  Dextromethorphan-Quinidine (NUEDEXTA) 20-10 MG CAPS Take 1  capsule by mouth 2 (two) times daily.  05/19/14   [provider]  KLOR-CON M20 20 MEQ tablet Take 20 mEq by mouth daily. 04/05/13   [provider]  LORazepam (ATIVAN) 0.5 MG tablet Take 0.5 mg by mouth 3 (three) times daily.     [provider]  Melatonin 5 MG CAPS Take by mouth. Takes one cap at bedtime.    [provider]  meloxicam (MOBIC) 7.5 MG tablet TAKE ONE TABLET (7.5 MG TOTAL) BY MOUTH DAILY. 08/29/14   [provider]  Memantine HCl-Donepezil HCl (NAMZARIC) 28-10 MG CP24 Take 1 capsule by mouth daily. 02/16/18   York Spaniel, MD  mirtazapine (REMERON) 45 MG tablet TAKE 1 TABLET (45 MG TOTAL) BY MOUTH AT  BEDTIME. 01/25/20   York Spaniel, MD  Multiple Vitamins-Minerals (WOMENS MULTIVITAMIN PLUS PO) Take by mouth.    [provider]  pravastatin (PRAVACHOL) 20 MG tablet Take 20 mg by mouth daily.    [provider]  Triamcinolone Acetonide (NASACORT ALLERGY 24HR NA) Place into the nose.    [provider]     Allergies:     Allergies  Allergen Reactions  . Dexlansoprazole Other (See Comments)  . Sodium Pentobarbital [Pentobarbital Sodium] Nausea And Vomiting     Physical Exam:   Vitals  Blood pressure (!) 127/91, pulse 98, temperature 98.1 F (36.7 C), temperature source Oral, resp. rate 17, height 5\' 8"  (1.727 m), weight 59 kg, SpO2 100 %.  1.  General: Laying supine in bed in no acute distress  2. Psychiatric: Cooperative with exam Oriented to self  3. Neurologic: Face symmetric, moves all 4 extremities voluntarily, speech and language normal  4. HEENMT:  Head is atraumatic, normocephalic, neck is supple, trachea is midline, mucous membranes moist  5. Respiratory : LCTABL, no wheezes, rhonchi or crackles  6. Cardiovascular : Heart rate normal, rhythm regular, no murmurs rubs or gallops  7. Gastrointestinal:  Abdomen is soft, non distended, nontender to palpation, no masses  palpated  8. Skin:  Skin is warm dry and intact without acute lesion on limited exam  9.Musculoskeletal:  ***    Data Review:    CBC Recent Labs  Lab 02/18/20 2126  WBC 12.8*  HGB 13.3  HCT 40.9  PLT 183  MCV 91.3  MCH 29.7  MCHC 32.5  RDW 13.1   ------------------------------------------------------------------------------------------------------------------  Results for orders placed or performed during the hospital encounter of 02/18/20 (from the past 48 hour(s))  Basic metabolic panel     Status: Abnormal   Collection Time: 02/18/20  9:26 PM  Result Value Ref Range   Sodium 140 135 - 145 mmol/L   Potassium 4.4 3.5 - 5.1 mmol/L   Chloride 111 98 - 111 mmol/L   CO2 23 22 - 32 mmol/L   Glucose, Bld 149 (H) 70 - 99 mg/dL    Comment: Glucose reference range applies only to samples taken after fasting for at least 8 hours.   BUN 36 (H) 8 - 23 mg/dL   Creatinine, Ser 04/17/20 0.44 - 1.00 mg/dL   Calcium 9.4 8.9 - 9.67 mg/dL   GFR, Estimated 89.3 >81 mL/min    Comment: (NOTE) Calculated using the CKD-EPI Creatinine Equation (2021)    Anion gap 6 5 - 15    Comment: Performed at [facility] [address], [place], [place]  CBC     Status: Abnormal   Collection Time: 02/18/20  9:26 PM  Result Value Ref Range   WBC 12.8 (H) 4.0 - 10.5 K/uL   RBC 4.48 3.87 - 5.11 MIL/uL   Hemoglobin 13.3 12.0 - 15.0 g/dL   HCT 04/17/20 58.5 - 27.7 %   MCV 91.3 80.0 - 100.0 fL   MCH 29.7 26.0 - 34.0 pg   MCHC 32.5 30.0 - 36.0 g/dL   RDW 82.4 23.5 - 36.1 %   Platelets 183 150 - 400 K/uL   nRBC 0.0 0.0 - 0.2 %    Comment: Performed at [facility], [address], [place], [place]  Type and screen     Status: None   Collection Time: 02/18/20  9:26 PM  Result Value Ref Range   ABO/RH(D) O NEG    Antibody Screen  NEG    Sample Expiration      02/21/2020,2359 Performed at [facility], [address] [place] [address], [place], [place] [address]     Chemistries  Recent Labs  Lab  02/18/20 2126  NA 140  K 4.4  CL 111  CO2 23  GLUCOSE 149*  BUN 36*  CREATININE 0.78  CALCIUM 9.4   ------------------------------------------------------------------------------------------------------------------  ------------------------------------------------------------------------------------------------------------------ GFR: Estimated Creatinine Clearance: 60.1 mL/min (by C-G formula based on SCr of 0.78 mg/dL). Liver Function Tests: No results for input(s): AST, ALT, ALKPHOS, BILITOT, PROT, ALBUMIN in the last 168 hours. No results for input(s): LIPASE, AMYLASE in the last 168 hours. No results for input(s): AMMONIA in the last 168 hours. Coagulation Profile: No results for input(s): INR, PROTIME in the last 168 hours. Cardiac Enzymes: No results for input(s): CKTOTAL, CKMB, CKMBINDEX, TROPONINI in the last 168 hours. BNP (last 3 results) No results for input(s): PROBNP in the last 8760 hours. HbA1C: No results for input(s): HGBA1C in the last 72 hours. CBG: No results for input(s): GLUCAP in the last 168 hours. Lipid Profile: No results for input(s): CHOL, HDL, LDLCALC, TRIG, CHOLHDL, LDLDIRECT in the last 72 hours. Thyroid Function Tests: No results for input(s): TSH, T4TOTAL, FREET4, T3FREE, THYROIDAB in the last 72 hours. Anemia Panel: No results for input(s): VITAMINB12, FOLATE, FERRITIN, TIBC, IRON, RETICCTPCT in the last 72 hours.  --------------------------------------------------------------------------------------------------------------- Urine analysis: No results found for: COLORURINE, APPEARANCEUR, LABSPEC, PHURINE, GLUCOSEU, HGBUR, BILIRUBINUR, KETONESUR, PROTEINUR, UROBILINOGEN, NITRITE, LEUKOCYTESUR    Imaging Results:    DG Chest 1 View  Result Date: 02/18/2020 CLINICAL DATA:  Fall, left hip fracture EXAM: CHEST  1 VIEW COMPARISON:  None. FINDINGS: The heart size and mediastinal contours are within normal limits. Both lungs are clear. The  visualized skeletal structures are unremarkable. IMPRESSION: Negative Electronically Signed   By: Charlett Nose M.D.   On: 02/18/2020 21:03   DG Hip Unilat With Pelvis 2-3 Views Left  Result Date: 02/18/2020 CLINICAL DATA:  Fall, left hip pain EXAM: DG HIP (WITH OR WITHOUT PELVIS) 2-3V LEFT COMPARISON:  None. FINDINGS: There is a left femoral intertrochanteric fracture. Mild varus angulation and displacement of fracture fragments. No subluxation or dislocation. IMPRESSION: Left femoral intertrochanteric fracture. Electronically Signed   By: Charlett Nose M.D.   On: 02/18/2020 21:03    My personal review of EKG: Rhythm ST, Rate 122 /min, QTc 459 ,no Acute ST changes   Assessment & Plan:    Principal Problem:   Intertrochanteric fracture of femur (HCC) Active Problems:   Essential hypertension   Alzheimer disease (HCC)   1. Intertrochanteric fracture Left 1. Mechanical fall 2. Pre-op Blood type and screen, CXR completed 3. UA pending 4. Ortho consulted from ED - will see in the AM 2. HTN 1. Hydralazine PRN 3. Dementia 1. Continue home meds 2. Redirect/reorient as needed 4.    DVT Prophylaxis-   Lovenox - SCDs ***  AM Labs Ordered, also please review Full Orders  Family Communication: Admission, patients condition and plan of care including tests being ordered have been discussed with the patient and **** who indicate understanding and agree with the plan and Code Status.  Code Status:  Full  Admission status: Inpatient :The appropriate admission status for this patient is INPATIENT. Inpatient status is judged to be reasonable and necessary in order to provide the required intensity of service to ensure the patient's safety. The patient's presenting symptoms, physical exam findings, and initial radiographic and laboratory data in the context of  their chronic comorbidities is felt to place them at high risk for further clinical deterioration. Furthermore, it is not anticipated that  the patient will be medically stable for discharge from the hospital within 2 midnights of admission. The following factors support the admission status of inpatient.     The patient's presenting symptoms include hip pain  The worrisome physical exam findings include hip tenderness The initial radiographic and laboratory data are worrisome because of left intertrochanteric femur fracture The chronic co-morbidities include dementia, HTN       * I certify that at the point of admission it is my clinical judgment that the patient will require inpatient hospital care spanning beyond 2 midnights from the point of admission due to high intensity of service, high risk for further deterioration and high frequency of surveillance required.*  Time spent in minutes : 64   Stefanie Hodgens B Zierle-Ghosh DO

## 2020-02-18 NOTE — ED Provider Notes (Signed)
[facility] EMERGENCY DEPARTMENT Provider Note   CSN: [id number] Arrival date & time: 02/18/20  2012     History Chief Complaint  Patient presents with  . Fall    Destiny Lopez is a 70 y.o. female.  Patient with hx advanced dementia, presents s/p fall. At baseline, walks limited distance in her home, and very slowly. Today was walking in narrow area between furniture when fell. No loc. Patient w left hip pain/deformity.  Patient limited historian - not verbally responsive at baseline - level 5 caveat. No anticoagulant use.   The history is provided by the patient, the EMS personnel and the spouse. The history is limited by the condition of the patient.  Fall       Past Medical History:  Diagnosis Date  . Alzheimer disease (HCC) 01/18/2018  . Chest pain, unspecified   . Dysthymic disorder   . Irritable bowel syndrome   . Memory disorder 04/10/2013  . Memory loss   . Other malaise and fatigue   . Pure hypercholesterolemia   . Shortness of breath   . Unspecified essential hypertension     Patient Active Problem List   Diagnosis Date Noted  . Alzheimer disease (HCC) 01/18/2018  . Memory disorder 04/10/2013  . 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 Other nonspecific abnormal result of function study of brain and central nervous system 10/04/2012  . Anxiety and depression 11/19/2011  . Dyslipidemia 11/19/2011  . Multinodular goiter 11/19/2011  . Osteopenia 11/19/2011  . Chest pain, unspecified   . Shortness of breath   . Chest pain, unspecified   . Essential hypertension   . Pure hypercholesterolemia     Past Surgical History:  Procedure Laterality Date  . APPENDECTOMY    . CHOLECYSTECTOMY    . TONSILLECTOMY       OB History   No obstetric history on file.     Family History  Problem Relation Age of Onset  . Coronary artery disease Father 45       Died MI age 66  . Alzheimer's disease Mother         Pacemaker  . Coronary artery disease Brother 40       Heart attacks in his 29s    Social History   Tobacco Use  . Smoking status: Never Smoker  . Smokeless tobacco: Never Used  Substance Use Topics  . Alcohol use: No  . Drug use: No    Home Medications Prior to Admission medications   Medication Sig Start Date End Date Taking? Authorizing Provider  Acetaminophen 650 MG TABS Take 2 tablets by mouth every 8 (eight) hours.     [provider]  Calcium Citrate (CITRACAL PO) Take by mouth. 600mg  tabs   Takes 2 tabs daily    [provider]  cetirizine (ZYRTEC) 10 MG tablet Take 10 mg by mouth daily.    [provider]  Dextromethorphan-Quinidine (NUEDEXTA) 20-10 MG CAPS Take 1 capsule by mouth 2 (two) times daily.  05/19/14   [provider]  KLOR-CON M20 20 MEQ tablet Take 20 mEq by mouth daily. 04/05/13   [provider]  LORazepam (ATIVAN) 0.5 MG tablet Take 0.5 mg by mouth 3 (three) times daily.     [provider]  Melatonin 5 MG CAPS Take by mouth. Takes one cap at bedtime.    [provider]  meloxicam (MOBIC) 7.5 MG tablet TAKE ONE TABLET (7.5 MG TOTAL) BY MOUTH DAILY.  08/29/14   [provider]  Memantine HCl-Donepezil HCl (NAMZARIC) 28-10 MG CP24 Take 1 capsule by mouth daily. 02/16/18   York Spaniel, MD  mirtazapine (REMERON) 45 MG tablet TAKE 1 TABLET (45 MG TOTAL) BY MOUTH AT BEDTIME. 01/25/20   York Spaniel, MD  Multiple Vitamins-Minerals (WOMENS MULTIVITAMIN PLUS PO) Take by mouth.    [provider]  pravastatin (PRAVACHOL) 20 MG tablet Take 20 mg by mouth daily.    [provider]  Triamcinolone Acetonide (NASACORT ALLERGY 24HR NA) Place into the nose.    [provider]    Allergies    Dexlansoprazole and Sodium pentobarbital [pentobarbital sodium]  Review of Systems   Review of Systems  Unable to perform ROS: Dementia  level 5 caveat, dementia, not verbally  responsive.   Physical Exam Updated Vital Signs Temp 98.1 F (36.7 C) (Oral)   Resp 18   Ht 1.727 m (5\' 8" )   Wt 59 kg   BMI 19.77 kg/m   Physical Exam Vitals and nursing note reviewed.  Constitutional:      Appearance: Normal appearance. She is well-developed.  HENT:     Head: Atraumatic.     Nose: Nose normal.     Mouth/Throat:     Mouth: Mucous membranes are moist.  Eyes:     General: No scleral icterus.    Conjunctiva/sclera: Conjunctivae normal.     Pupils: Pupils are equal, round, and reactive to light.  Neck:     Trachea: No tracheal deviation.  Cardiovascular:     Rate and Rhythm: Normal rate and regular rhythm.     Pulses: Normal pulses.     Heart sounds: Normal heart sounds. No murmur heard. No friction rub. No gallop.   Pulmonary:     Effort: Pulmonary effort is normal. No respiratory distress.     Breath sounds: Normal breath sounds.  Chest:     Chest wall: No tenderness.  Abdominal:     General: Bowel sounds are normal. There is no distension.     Palpations: Abdomen is soft.     Tenderness: There is no abdominal tenderness. There is no guarding.  Genitourinary:    Comments: No cva tenderness.  Musculoskeletal:     Cervical back: Normal range of motion and neck supple. No rigidity or tenderness. No muscular tenderness.     Comments: Shortening/rotation LLE. Distal pulses palp bil. Tenderness left hip, no other focal bony tenderness on bilateral extremity exam. CTLS spine, non tender, aligned, no step off.   Skin:    General: Skin is warm and dry.     Findings: No rash.  Neurological:     Mental Status: She is alert.     Comments: Alert, speech normal. Motor/sens grossly intact bil.   Psychiatric:        Mood and Affect: Mood normal.     ED Results / Procedures / Treatments   Labs (all labs ordered are listed, but only abnormal results are displayed) Results for orders placed or performed during the hospital encounter of 02/18/20  Basic  metabolic panel  Result Value Ref Range   Sodium 140 135 - 145 mmol/L   Potassium 4.4 3.5 - 5.1 mmol/L   Chloride 111 98 - 111 mmol/L   CO2 23 22 - 32 mmol/L   Glucose, Bld 149 (H) 70 - 99 mg/dL   BUN 36 (H) 8 - 23 mg/dL   Creatinine, Ser 8.27 0.44 - 1.00 mg/dL   Calcium 9.4  8.9 - 10.3 mg/dL   GFR, Estimated >32 >20 mL/min   Anion gap 6 5 - 15  CBC  Result Value Ref Range   WBC 12.8 (H) 4.0 - 10.5 K/uL   RBC 4.48 3.87 - 5.11 MIL/uL   Hemoglobin 13.3 12.0 - 15.0 g/dL   HCT 25.4 27.0 - 62.3 %   MCV 91.3 80.0 - 100.0 fL   MCH 29.7 26.0 - 34.0 pg   MCHC 32.5 30.0 - 36.0 g/dL   RDW 76.2 83.1 - 51.7 %   Platelets 183 150 - 400 K/uL   nRBC 0.0 0.0 - 0.2 %  Type and screen  Result Value Ref Range   ABO/RH(D) O NEG    Antibody Screen PENDING    Sample Expiration      02/21/2020,2359 Performed at [facility], [address], [place], [place] [address]    DG Chest 1 View  Result Date: 02/18/2020 CLINICAL DATA:  Fall, left hip fracture EXAM: CHEST  1 VIEW COMPARISON:  None. FINDINGS: The heart size and mediastinal contours are within normal limits. Both lungs are clear. The visualized skeletal structures are unremarkable. IMPRESSION: Negative Electronically Signed   By: Charlett Nose M.D.   On: 02/18/2020 21:03   DG Hip Unilat With Pelvis 2-3 Views Left  Result Date: 02/18/2020 CLINICAL DATA:  Fall, left hip pain EXAM: DG HIP (WITH OR WITHOUT PELVIS) 2-3V LEFT COMPARISON:  None. FINDINGS: There is a left femoral intertrochanteric fracture. Mild varus angulation and displacement of fracture fragments. No subluxation or dislocation. IMPRESSION: Left femoral intertrochanteric fracture. Electronically Signed   By: Charlett Nose M.D.   On: 02/18/2020 21:03    EKG None  Radiology DG Chest 1 View  Result Date: 02/18/2020 CLINICAL DATA:  Fall, left hip fracture EXAM: CHEST  1 VIEW COMPARISON:  None. FINDINGS: The heart size and mediastinal contours are within normal limits. Both lungs are  clear. The visualized skeletal structures are unremarkable. IMPRESSION: Negative Electronically Signed   By: Charlett Nose M.D.   On: 02/18/2020 21:03   DG Hip Unilat With Pelvis 2-3 Views Left  Result Date: 02/18/2020 CLINICAL DATA:  Fall, left hip pain EXAM: DG HIP (WITH OR WITHOUT PELVIS) 2-3V LEFT COMPARISON:  None. FINDINGS: There is a left femoral intertrochanteric fracture. Mild varus angulation and displacement of fracture fragments. No subluxation or dislocation. IMPRESSION: Left femoral intertrochanteric fracture. Electronically Signed   By: Charlett Nose M.D.   On: 02/18/2020 21:03    Procedures Procedures   Medications Ordered in ED Medications  morphine 4 MG/ML injection 4 mg (has no administration in time range)  ondansetron (ZOFRAN) injection 4 mg (has no administration in time range)    ED Course  I have reviewed the triage vital signs and the nursing notes.  Pertinent labs & imaging results that were available during my care of the patient were reviewed by me and considered in my medical decision making (see chart for details).    MDM Rules/Calculators/A&P                         Iv ns. Morphine iv. zofran iv. Imaging studies ordered.   Reviewed nursing notes and prior charts for additional history.   History from spouse. He indicates pt is at her baseline, which is advanced dementia, not verbally responsive.   Xrays reviewed/interpreted by me - left hip fx.   Orthopedic surgery on call consulted. Discussed pt with Dr Dallas Schimke - he  indicates tentatively will plan for surgery some time tomorrow.   Hospitalists consulted for admission.  Labs reviewed/interpreteed by me - chem normal.   Recheck pt, comfortable appearing, pain appears controlled.    Final Clinical Impression(s) / ED Diagnoses Final diagnoses:  Fall    Rx / DC Orders ED Discharge Orders    None       Cathren Laine, MD 02/18/20 2222

## 2020-02-18 NOTE — H&P (Signed)
TRH H&P    Patient Demographics:    Destiny Lopez, is a 70 y.o. female  MRN: [id number]  DOB - [birth year]  Admit Date - 02/18/2020  Referring MD/NP/PA: Denton Lank  Outpatient Primary MD for the patient is [name], MD  Patient coming from: Home  Chief complaint- mechanical fall   HPI:    Destiny Lopez  is a 70 y.o. female, with history of HTN, HLD, and advanced dementia presents to the ED s/p fall. Patient has advanced dementia and does not communicate at baseline. She does walk slowly around the home. She was doing her normal roaming around the home when the husband heard a "boom" in the back bedroom. He said he found her on the floor, very upset. She was near a recliner and two book shelves, but none of them looked like she had fallen into them. Patient is not able to say if she had any preceding symptoms. Husband reports that she has been eating and drinking normally, has not had any fevers, and has not indicated any pain.   Patient is fully vaccinated and boosted for covid.   Patient is DNR.  IN the ED T 98.1, HR 91-122, R 17, BP 127/91 100% Leukocytosis 12.8 Chem panel unremarkable XR femur left shows intertrochanteric fracture Ortho consulted from the ED and plans to see patient in the a.m. EKG shows heart rate 122 sinus tach, QTC 459 Chest x-ray is negative for acute disease Covid negative      Review of systems:    Review of Systems  Unable to perform ROS: Dementia       Past History of the following :    Past Medical History:  Diagnosis Date  . Alzheimer disease (HCC) 01/18/2018  . Chest pain, unspecified   . Dysthymic disorder   . Irritable bowel syndrome   . Memory disorder 04/10/2013  . Memory loss   . Other malaise and fatigue   . Pure hypercholesterolemia   . Shortness of breath   . Unspecified essential hypertension       Past Surgical History:  Procedure  Laterality Date  . APPENDECTOMY    . CHOLECYSTECTOMY    . TONSILLECTOMY        Social History:      Social History   Tobacco Use  . Smoking status: Never Smoker  . Smokeless tobacco: Never Used  Substance Use Topics  . Alcohol use: No       Family History :     Family History  Problem Relation Age of Onset  . Coronary artery disease Father 6       Died MI age 71  . Alzheimer's disease Mother        Pacemaker  . Coronary artery disease Brother 40       Heart attacks in his 1s      Home Medications:   Prior to Admission medications   Medication Sig Start Date End Date Taking? Authorizing Provider  Acetaminophen 650 MG TABS Take 2 tablets by mouth every 8 (eight) hours.  [provider]  Calcium Citrate (CITRACAL PO) Take by mouth. 600mg  tabs   Takes 2 tabs daily    [provider]  cetirizine (ZYRTEC) 10 MG tablet Take 10 mg by mouth daily.    [provider]  Dextromethorphan-Quinidine (NUEDEXTA) 20-10 MG CAPS Take 1 capsule by mouth 2 (two) times daily.  05/19/14   [provider]  KLOR-CON M20 20 MEQ tablet Take 20 mEq by mouth daily. 04/05/13   [provider]  LORazepam (ATIVAN) 0.5 MG tablet Take 0.5 mg by mouth 3 (three) times daily.     [provider]  Melatonin 5 MG CAPS Take by mouth. Takes one cap at bedtime.    [provider]  meloxicam (MOBIC) 7.5 MG tablet TAKE ONE TABLET (7.5 MG TOTAL) BY MOUTH DAILY. 08/29/14   [provider]  Memantine HCl-Donepezil HCl (NAMZARIC) 28-10 MG CP24 Take 1 capsule by mouth daily. 02/16/18   04/17/18, MD  mirtazapine (REMERON) 45 MG tablet TAKE 1 TABLET (45 MG TOTAL) BY MOUTH AT BEDTIME. 01/25/20   01/27/20, MD  Multiple Vitamins-Minerals (WOMENS MULTIVITAMIN PLUS PO) Take by mouth.    [provider]  pravastatin (PRAVACHOL) 20 MG tablet Take 20 mg by mouth daily.    [provider]  Triamcinolone Acetonide  (NASACORT ALLERGY 24HR NA) Place into the nose.    [provider]     Allergies:     Allergies  Allergen Reactions  . Dexlansoprazole Other (See Comments)  . Sodium Pentobarbital [Pentobarbital Sodium] Nausea And Vomiting     Physical Exam:   Vitals  Blood pressure (!) 127/91, pulse 98, temperature 98.1 F (36.7 C), temperature source Oral, resp. rate 17, height 5\' 8"  (1.727 m), weight 59 kg, SpO2 100 %.  1.  General: Laying supine in bed in no acute distress  2. Psychiatric: Cooperative with exam Oriented to self  3. Neurologic: Face symmetric, moves all 4 extremities voluntarily, speech and language normal  4. HEENMT:  Head is atraumatic, normocephalic, neck is supple, trachea is midline, mucous membranes moist  5. Respiratory : LCTABL, no wheezes, rhonchi or crackles  6. Cardiovascular : Heart rate normal, rhythm regular, no murmurs rubs or gallops  7. Gastrointestinal:  Abdomen is soft, non distended, nontender to palpation, no masses palpated  8. Skin:  Skin is warm dry and intact without acute lesion on limited exam  9.Musculoskeletal:  Left leg is abducted, no bruising of the hip, grimace to palpation of hip, no grimace to palpation of calves, peripheral pulses palpated    Data Review:    CBC Recent Labs  Lab 02/18/20 2126  WBC 12.8*  HGB 13.3  HCT 40.9  PLT 183  MCV 91.3  MCH 29.7  MCHC 32.5  RDW 13.1   ------------------------------------------------------------------------------------------------------------------  Results for orders placed or performed during the hospital encounter of 02/18/20 (from the past 48 hour(s))  Basic metabolic panel     Status: Abnormal   Collection Time: 02/18/20  9:26 PM  Result Value Ref Range   Sodium 140 135 - 145 mmol/L   Potassium 4.4 3.5 - 5.1 mmol/L   Chloride 111 98 - 111 mmol/L   CO2 23 22 - 32 mmol/L   Glucose, Bld 149 (H) 70 - 99 mg/dL    Comment: Glucose reference range applies only  to samples taken after fasting for at least 8 hours.   BUN 36 (H) 8 - 23 mg/dL   Creatinine, Ser 04/17/20 0.44 -  1.00 mg/dL   Calcium 9.4 8.9 - 10.2 mg/dL   GFR, Estimated >58 >52 mL/min    Comment: (NOTE) Calculated using the CKD-EPI Creatinine Equation (2021)    Anion gap 6 5 - 15    Comment: Performed at [facility], [address] [place], [place] [address]  CBC     Status: Abnormal   Collection Time: 02/18/20  9:26 PM  Result Value Ref Range   WBC 12.8 (H) 4.0 - 10.5 K/uL   RBC 4.48 3.87 - 5.11 MIL/uL   Hemoglobin 13.3 12.0 - 15.0 g/dL   HCT 23.5 36.1 - 44.3 %   MCV 91.3 80.0 - 100.0 fL   MCH 29.7 26.0 - 34.0 pg   MCHC 32.5 30.0 - 36.0 g/dL   RDW 15.4 00.8 - 67.6 %   Platelets 183 150 - 400 K/uL   nRBC 0.0 0.0 - 0.2 %    Comment: Performed at [facility], [address], [place], [place] [address]  Type and screen     Status: None   Collection Time: 02/18/20  9:26 PM  Result Value Ref Range   ABO/RH(D) O NEG    Antibody Screen NEG    Sample Expiration      02/21/2020,2359 Performed at [facility], [address] [place], [place] [address]     Chemistries  Recent Labs  Lab 02/18/20 2126  NA 140  K 4.4  CL 111  CO2 23  GLUCOSE 149*  BUN 36*  CREATININE 0.78  CALCIUM 9.4   ------------------------------------------------------------------------------------------------------------------  ------------------------------------------------------------------------------------------------------------------ GFR: Estimated Creatinine Clearance: 60.1 mL/min (by C-G formula based on SCr of 0.78 mg/dL). Liver Function Tests: No results for input(s): AST, ALT, ALKPHOS, BILITOT, PROT, ALBUMIN in the last 168 hours. No results for input(s): LIPASE, AMYLASE in the last 168 hours. No results for input(s): AMMONIA in the last 168 hours. Coagulation Profile: No results for input(s): INR, PROTIME in the last 168 hours. Cardiac Enzymes: No results for input(s): CKTOTAL, CKMB,  CKMBINDEX, TROPONINI in the last 168 hours. BNP (last 3 results) No results for input(s): PROBNP in the last 8760 hours. HbA1C: No results for input(s): HGBA1C in the last 72 hours. CBG: No results for input(s): GLUCAP in the last 168 hours. Lipid Profile: No results for input(s): CHOL, HDL, LDLCALC, TRIG, CHOLHDL, LDLDIRECT in the last 72 hours. Thyroid Function Tests: No results for input(s): TSH, T4TOTAL, FREET4, T3FREE, THYROIDAB in the last 72 hours. Anemia Panel: No results for input(s): VITAMINB12, FOLATE, FERRITIN, TIBC, IRON, RETICCTPCT in the last 72 hours.  --------------------------------------------------------------------------------------------------------------- Urine analysis: No results found for: COLORURINE, APPEARANCEUR, LABSPEC, PHURINE, GLUCOSEU, HGBUR, BILIRUBINUR, KETONESUR, PROTEINUR, UROBILINOGEN, NITRITE, LEUKOCYTESUR    Imaging Results:    DG Chest 1 View  Result Date: 02/18/2020 CLINICAL DATA:  Fall, left hip fracture EXAM: CHEST  1 VIEW COMPARISON:  None. FINDINGS: The heart size and mediastinal contours are within normal limits. Both lungs are clear. The visualized skeletal structures are unremarkable. IMPRESSION: Negative Electronically Signed   By: Charlett Nose M.D.   On: 02/18/2020 21:03   DG Hip Unilat With Pelvis 2-3 Views Left  Result Date: 02/18/2020 CLINICAL DATA:  Fall, left hip pain EXAM: DG HIP (WITH OR WITHOUT PELVIS) 2-3V LEFT COMPARISON:  None. FINDINGS: There is a left femoral intertrochanteric fracture. Mild varus angulation and displacement of fracture fragments. No subluxation or dislocation. IMPRESSION: Left femoral intertrochanteric fracture. Electronically Signed   By: Charlett Nose M.D.   On: 02/18/2020 21:03  My personal review of EKG: Rhythm ST, Rate 122 /min, QTc 459 ,no Acute ST changes   Assessment & Plan:    Principal Problem:   Intertrochanteric fracture of femur (HCC) Active Problems:   Essential hypertension    Alzheimer disease (HCC)   1. Intertrochanteric fracture Left 1. Mechanical fall 2. Pre-op Blood type and screen, CXR completed 3. UA pending 4. Ortho consulted from ED - will see in the AM 2. HTN 1. Hydralazine PRN 3. Dementia 1. Continue home meds 2. Redirect/reorient as needed 4.    DVT Prophylaxis-Lovenox- SCDs   AM Labs Ordered, also please review Full Orders  Family Communication: Admission, patients condition and plan of care including tests being ordered have been discussed with the patient and husband who indicate understanding and agree with the plan and Code Status.  Code Status: DNR  Admission status: Inpatient :The appropriate admission status for this patient is INPATIENT. Inpatient status is judged to be reasonable and necessary in order to provide the required intensity of service to ensure the patient's safety. The patient's presenting symptoms, physical exam findings, and initial radiographic and laboratory data in the context of their chronic comorbidities is felt to place them at high risk for further clinical deterioration. Furthermore, it is not anticipated that the patient will be medically stable for discharge from the hospital within 2 midnights of admission. The following factors support the admission status of inpatient.     The patient's presenting symptoms include hip pain  The worrisome physical exam findings include hip tenderness The initial radiographic and laboratory data are worrisome because of left intertrochanteric femur fracture The chronic co-morbidities include dementia, HTN       * I certify that at the point of admission it is my clinical judgment that the patient will require inpatient hospital care spanning beyond 2 midnights from the point of admission due to high intensity of service, high risk for further deterioration and high frequency of surveillance required.*  Time spent in minutes : 64   Shiheem Corporan B Zierle-Ghosh DO

## 2020-02-18 NOTE — ED Triage Notes (Signed)
Pt brought in by EMS after pt sustained a fall and questionable broken L hip. Pt with hx of Alzheimer's and is non-verbal. Husband at bedside. Pt not on any blood thinners per EMS.

## 2020-02-19 ENCOUNTER — Inpatient Hospital Stay (HOSPITAL_COMMUNITY): Payer: Medicare PPO | Admitting: Certified Registered"

## 2020-02-19 ENCOUNTER — Encounter (HOSPITAL_COMMUNITY): Payer: Self-pay | Admitting: Family Medicine

## 2020-02-19 ENCOUNTER — Inpatient Hospital Stay (HOSPITAL_COMMUNITY): Payer: Medicare PPO

## 2020-02-19 ENCOUNTER — Encounter (HOSPITAL_COMMUNITY)
Admission: EM | Disposition: A | Discharge status: Skilled Nursing Facility | Payer: Self-pay | Source: Home / Self Care | Attending: Family Medicine

## 2020-02-19 DIAGNOSIS — W19XXXA Unspecified fall, initial encounter: Secondary | ICD-10-CM | POA: Diagnosis not present

## 2020-02-19 DIAGNOSIS — G309 Alzheimer's disease, unspecified: Secondary | ICD-10-CM | POA: Diagnosis not present

## 2020-02-19 DIAGNOSIS — S72145A Nondisplaced intertrochanteric fracture of left femur, initial encounter for closed fracture: Secondary | ICD-10-CM

## 2020-02-19 DIAGNOSIS — F0280 Dementia in other diseases classified elsewhere without behavioral disturbance: Secondary | ICD-10-CM | POA: Diagnosis not present

## 2020-02-19 DIAGNOSIS — I10 Essential (primary) hypertension: Secondary | ICD-10-CM | POA: Diagnosis not present

## 2020-02-19 HISTORY — PX: INTRAMEDULLARY (IM) NAIL INTERTROCHANTERIC: SHX5875

## 2020-02-19 LAB — PROTIME-INR
INR: 1.1 (ref 0.8–1.2)
Prothrombin Time: 14.1 seconds (ref 11.4–15.2)

## 2020-02-19 LAB — URINALYSIS, ROUTINE W REFLEX MICROSCOPIC
Bilirubin Urine: NEGATIVE
Glucose, UA: NEGATIVE mg/dL
Ketones, ur: 5 mg/dL — AB
Leukocytes,Ua: NEGATIVE
Nitrite: POSITIVE — AB
Protein, ur: 30 mg/dL — AB
Specific Gravity, Urine: 1.028 (ref 1.005–1.030)
pH: 5 (ref 5.0–8.0)

## 2020-02-19 LAB — CBC
HCT: 37 % (ref 36.0–46.0)
Hemoglobin: 11.8 g/dL — ABNORMAL LOW (ref 12.0–15.0)
MCH: 29.7 pg (ref 26.0–34.0)
MCHC: 31.9 g/dL (ref 30.0–36.0)
MCV: 93.2 fL (ref 80.0–100.0)
Platelets: 146 10*3/uL — ABNORMAL LOW (ref 150–400)
RBC: 3.97 MIL/uL (ref 3.87–5.11)
RDW: 13.2 % (ref 11.5–15.5)
WBC: 10.3 10*3/uL (ref 4.0–10.5)
nRBC: 0 % (ref 0.0–0.2)

## 2020-02-19 LAB — COMPREHENSIVE METABOLIC PANEL
ALT: 23 U/L (ref 0–44)
AST: 25 U/L (ref 15–41)
Albumin: 3.3 g/dL — ABNORMAL LOW (ref 3.5–5.0)
Alkaline Phosphatase: 56 U/L (ref 38–126)
Anion gap: 7 (ref 5–15)
BUN: 34 mg/dL — ABNORMAL HIGH (ref 8–23)
CO2: 24 mmol/L (ref 22–32)
Calcium: 8.7 mg/dL — ABNORMAL LOW (ref 8.9–10.3)
Chloride: 111 mmol/L (ref 98–111)
Creatinine, Ser: 0.69 mg/dL (ref 0.44–1.00)
GFR, Estimated: >60 mL/min (ref >60)
Glucose, Bld: 145 mg/dL — ABNORMAL HIGH (ref 70–99)
Potassium: 3.7 mmol/L (ref 3.5–5.1)
Sodium: 142 mmol/L (ref 135–145)
Total Bilirubin: 1.1 mg/dL (ref 0.3–1.2)
Total Protein: 5.8 g/dL — ABNORMAL LOW (ref 6.5–8.1)

## 2020-02-19 LAB — SURGICAL PCR SCREEN
MRSA, PCR: NEGATIVE
Staphylococcus aureus: NEGATIVE

## 2020-02-19 LAB — ABO/RH: ABO/RH(D): O NEG

## 2020-02-19 LAB — SARS CORONAVIRUS 2 BY RT PCR (HOSPITAL ORDER, PERFORMED IN ~~LOC~~ HOSPITAL LAB): SARS Coronavirus 2: NEGATIVE

## 2020-02-19 SURGERY — FIXATION, FRACTURE, INTERTROCHANTERIC, WITH INTRAMEDULLARY ROD
Anesthesia: Spinal | Site: Hip | Laterality: Left

## 2020-02-19 MED ORDER — BUPIVACAINE IN DEXTROSE 0.75-8.25 % IT SOLN
INTRATHECAL | Status: DC | PRN
  Administered 2020-02-19: 11.25 mg via INTRATHECAL

## 2020-02-19 MED ORDER — BUPIVACAINE HCL (PF) 0.5 % IJ SOLN
INTRAMUSCULAR | Status: DC | PRN
  Administered 2020-02-19: 30 mL

## 2020-02-19 MED ORDER — ORAL CARE MOUTH RINSE: 15.0000 mL | Freq: Once | OROMUCOSAL | Status: DC

## 2020-02-19 MED ORDER — MORPHINE SULFATE (PF) 2 MG/ML IV SOLN: 2.0000 mg | INTRAVENOUS | Status: DC | PRN

## 2020-02-19 MED ORDER — MUPIROCIN 2 % EX OINT: 1.0000 "application " | TOPICAL_OINTMENT | Freq: Two times a day (BID) | CUTANEOUS | Status: DC

## 2020-02-19 MED ORDER — FENTANYL CITRATE (PF) 100 MCG/2ML IJ SOLN
INTRAMUSCULAR | Status: AC
  Filled 2020-02-19: qty 2

## 2020-02-19 MED ORDER — DONEPEZIL HCL 5 MG PO TABS
10.0000 mg | ORAL_TABLET | Freq: Every day | ORAL | Status: DC
Start: 2020-02-19 — End: 2020-02-20
  Administered 2020-02-19: 10 mg via ORAL
  Filled 2020-02-19: qty 2

## 2020-02-19 MED ORDER — PHENYLEPHRINE HCL (PRESSORS) 10 MG/ML IV SOLN
INTRAVENOUS | Status: DC | PRN
  Administered 2020-02-19: 80 ug via INTRAVENOUS
  Administered 2020-02-19 (×2): 40 ug via INTRAVENOUS
  Administered 2020-02-19: 80 ug via INTRAVENOUS
  Administered 2020-02-19: 40 ug via INTRAVENOUS
  Administered 2020-02-19: 80 ug via INTRAVENOUS
  Administered 2020-02-19: 40 ug via INTRAVENOUS
  Administered 2020-02-19: 80 ug via INTRAVENOUS
  Administered 2020-02-19 (×2): 40 ug via INTRAVENOUS
  Administered 2020-02-19: 80 ug via INTRAVENOUS

## 2020-02-19 MED ORDER — EPINEPHRINE PF 1 MG/ML IJ SOLN
INTRAMUSCULAR | Status: DC | PRN
  Administered 2020-02-19: .1 mg via INTRATHECAL

## 2020-02-19 MED ORDER — ACETAMINOPHEN 650 MG RE SUPP: 650.0000 mg | Freq: Four times a day (QID) | RECTAL | Status: DC | PRN

## 2020-02-19 MED ORDER — FENTANYL CITRATE (PF) 100 MCG/2ML IJ SOLN
INTRAMUSCULAR | Status: DC | PRN
  Administered 2020-02-19: 30 ug via INTRAVENOUS

## 2020-02-19 MED ORDER — PROPOFOL 500 MG/50ML IV EMUL
INTRAVENOUS | Status: DC | PRN
  Administered 2020-02-19: 10 ug/kg/min via INTRAVENOUS

## 2020-02-19 MED ORDER — ONDANSETRON HCL 4 MG/2ML IJ SOLN: 4.0000 mg | Freq: Four times a day (QID) | INTRAMUSCULAR | Status: DC | PRN

## 2020-02-19 MED ORDER — LACTATED RINGERS IV SOLN: INTRAVENOUS | Status: DC

## 2020-02-19 MED ORDER — PROPOFOL 10 MG/ML IV BOLUS
INTRAVENOUS | Status: DC | PRN
Start: 2020-02-19 — End: 2020-02-19
  Administered 2020-02-19: 20 mg via INTRAVENOUS

## 2020-02-19 MED ORDER — MELATONIN 3 MG PO TABS: 6.0000 mg | ORAL_TABLET | Freq: Every evening | ORAL | Status: DC | PRN

## 2020-02-19 MED ORDER — BUPIVACAINE HCL (PF) 0.5 % IJ SOLN
INTRAMUSCULAR | Status: AC
  Filled 2020-02-19: qty 30

## 2020-02-19 MED ORDER — CEFAZOLIN SODIUM-DEXTROSE 1-4 GM/50ML-% IV SOLN
1.0000 g | Freq: Three times a day (TID) | INTRAVENOUS | Status: DC
  Administered 2020-02-19 – 2020-02-20 (×2): 1 g via INTRAVENOUS
  Filled 2020-02-19 (×3): qty 50

## 2020-02-19 MED ORDER — MEMANTINE HCL-DONEPEZIL HCL ER 28-10 MG PO CP24: 1.0000 | ORAL_CAPSULE | Freq: Every day | ORAL | Status: DC

## 2020-02-19 MED ORDER — OXYCODONE HCL 5 MG PO TABS
5.0000 mg | ORAL_TABLET | ORAL | Status: DC | PRN
  Administered 2020-02-19: 5 mg via ORAL
  Filled 2020-02-19: qty 1

## 2020-02-19 MED ORDER — MIRTAZAPINE 30 MG PO TABS
45.0000 mg | ORAL_TABLET | Freq: Every day | ORAL | Status: DC
  Administered 2020-02-19: 45 mg via ORAL
  Filled 2020-02-19: qty 1

## 2020-02-19 MED ORDER — CHLORHEXIDINE GLUCONATE CLOTH 2 % EX PADS
6.0000 | MEDICATED_PAD | Freq: Every day | CUTANEOUS | Status: DC
  Administered 2020-02-19 – 2020-02-20 (×2): 6 via TOPICAL

## 2020-02-19 MED ORDER — CEFAZOLIN SODIUM-DEXTROSE 1-4 GM/50ML-% IV SOLN
INTRAVENOUS | Status: AC
  Filled 2020-02-19: qty 100

## 2020-02-19 MED ORDER — EPINEPHRINE PF 1 MG/ML IJ SOLN
INTRAMUSCULAR | Status: AC
  Filled 2020-02-19: qty 1

## 2020-02-19 MED ORDER — ACETAMINOPHEN 325 MG PO TABS
650.0000 mg | ORAL_TABLET | Freq: Four times a day (QID) | ORAL | Status: DC | PRN
  Administered 2020-02-20: 650 mg via ORAL
  Filled 2020-02-19: qty 2

## 2020-02-19 MED ORDER — PRAVASTATIN SODIUM 10 MG PO TABS
20.0000 mg | ORAL_TABLET | Freq: Every day | ORAL | Status: DC
Start: 2020-02-19 — End: 2020-02-20

## 2020-02-19 MED ORDER — LORAZEPAM 0.5 MG PO TABS
0.5000 mg | ORAL_TABLET | Freq: Three times a day (TID) | ORAL | Status: DC
  Administered 2020-02-19 – 2020-02-20 (×3): 0.5 mg via ORAL
  Filled 2020-02-19 (×3): qty 1

## 2020-02-19 MED ORDER — CHLORHEXIDINE GLUCONATE 0.12 % MT SOLN: 15.0000 mL | Freq: Once | OROMUCOSAL | Status: DC

## 2020-02-19 MED ORDER — HEPARIN SODIUM (PORCINE) 5000 UNIT/ML IJ SOLN
5000.0000 [IU] | Freq: Three times a day (TID) | INTRAMUSCULAR | Status: DC
  Administered 2020-02-19 – 2020-02-20 (×2): 5000 [IU] via SUBCUTANEOUS
  Filled 2020-02-19 (×3): qty 1

## 2020-02-19 MED ORDER — SODIUM CHLORIDE 0.9 % IR SOLN
Status: DC | PRN
  Administered 2020-02-19: 1000 mL

## 2020-02-19 MED ORDER — FENTANYL CITRATE (PF) 100 MCG/2ML IJ SOLN
INTRAMUSCULAR | Status: DC | PRN
  Administered 2020-02-19: 20 ug via INTRATHECAL

## 2020-02-19 MED ORDER — DEXTROMETHORPHAN-QUINIDINE 20-10 MG PO CAPS: 1.0000 | ORAL_CAPSULE | Freq: Two times a day (BID) | ORAL | Status: DC

## 2020-02-19 MED ORDER — MEMANTINE HCL ER 28 MG PO CP24
28.0000 mg | ORAL_CAPSULE | Freq: Every day | ORAL | Status: DC
  Administered 2020-02-19 – 2020-02-20 (×2): 28 mg via ORAL
  Filled 2020-02-19 (×3): qty 1

## 2020-02-19 MED ORDER — SODIUM CHLORIDE 0.9 % IV SOLN: INTRAVENOUS | Status: DC

## 2020-02-19 MED ORDER — LORAZEPAM 2 MG/ML IJ SOLN
1.0000 mg | Freq: Every evening | INTRAMUSCULAR | Status: DC | PRN
  Administered 2020-02-19: 1 mg via INTRAVENOUS
  Filled 2020-02-19: qty 1

## 2020-02-19 MED ORDER — ONDANSETRON HCL 4 MG PO TABS: 4.0000 mg | ORAL_TABLET | Freq: Four times a day (QID) | ORAL | Status: DC | PRN

## 2020-02-19 SURGICAL SUPPLY — 63 items
APL PRP STRL LF DISP 70% ISPRP (MISCELLANEOUS) ×1
BIT DRILL 4.0X280 (BIT) ×2 IMPLANT
BIT DRILL AO GAMMA 4.2X180 (BIT) ×1 IMPLANT
BLADE SURG SZ10 CARB STEEL (BLADE) ×4 IMPLANT
BNDG GAUZE ELAST 4 BULKY (GAUZE/BANDAGES/DRESSINGS) ×2 IMPLANT
BRUSH SCRUB EZ W/ULTRADEX 3%PC (MISCELLANEOUS) ×1 IMPLANT
CHLORAPREP W/TINT 26 (MISCELLANEOUS) ×2 IMPLANT
CLOTH BEACON ORANGE TIMEOUT ST (SAFETY) ×2 IMPLANT
COVER LIGHT HANDLE STERIS (MISCELLANEOUS) ×4 IMPLANT
COVER MAYO STAND XLG (MISCELLANEOUS) ×2 IMPLANT
COVER WAND RF STERILE (DRAPES) ×2 IMPLANT
DECANTER SPIKE VIAL GLASS SM (MISCELLANEOUS) ×2 IMPLANT
DRAPE HALF SHEET 40X57 (DRAPES) ×2 IMPLANT
DRAPE INCISE IOBAN 44X35 STRL (DRAPES) IMPLANT
DRAPE STERI IOBAN 125X83 (DRAPES) ×2 IMPLANT
DRSG PAD ABDOMINAL 8X10 ST (GAUZE/BANDAGES/DRESSINGS) ×2 IMPLANT
DRSG TEGADERM 4X4.75 (GAUZE/BANDAGES/DRESSINGS) ×7 IMPLANT
ELECT REM PT RETURN 9FT ADLT (ELECTROSURGICAL) ×2
ELECTRODE REM PT RTRN 9FT ADLT (ELECTROSURGICAL) ×1 IMPLANT
GAUZE SPONGE 4X4 12PLY STRL (GAUZE/BANDAGES/DRESSINGS) ×2 IMPLANT
GAUZE XEROFORM 1X8 LF (GAUZE/BANDAGES/DRESSINGS) ×4 IMPLANT
GLOVE BIOGEL PI IND STRL 8 (GLOVE) ×1 IMPLANT
GLOVE BIOGEL PI INDICATOR 8 (GLOVE) ×1
GLOVE SKINSENSE NS SZ8.0 LF (GLOVE) ×2
GLOVE SKINSENSE STRL SZ8.0 LF (GLOVE) ×2 IMPLANT
GLOVE SURG UNDER POLY LF SZ7 (GLOVE) ×4 IMPLANT
GOWN STRL REUS W/ TWL XL LVL3 (GOWN DISPOSABLE) ×1 IMPLANT
GOWN STRL REUS W/TWL LRG LVL3 (GOWN DISPOSABLE) ×3 IMPLANT
GOWN STRL REUS W/TWL XL LVL3 (GOWN DISPOSABLE) ×2
GUIDEROD T2 3X1000 (ROD) ×1 IMPLANT
GUIDEWIRE BALL NOSE 3.0X900 (WIRE) ×2
GUIDEWIRE ORTH 900X3XBALL NOSE (WIRE) IMPLANT
INST SET MAJOR BONE (KITS) ×2 IMPLANT
K-WIRE  3.2X450M STR (WIRE) ×2
K-WIRE 3.2X450M STR (WIRE) ×1
KIT BLADEGUARD II DBL (SET/KITS/TRAYS/PACK) ×2 IMPLANT
KIT TURNOVER CYSTO (KITS) ×2 IMPLANT
KWIRE 3.2X450M STR (WIRE) ×1 IMPLANT
MANIFOLD NEPTUNE II (INSTRUMENTS) ×2 IMPLANT
MARKER SKIN DUAL TIP RULER LAB (MISCELLANEOUS) ×2 IMPLANT
NAIL TROCH ES 10X39X125 (Nail) ×1 IMPLANT
NDL HYPO 21X1.5 SAFETY (NEEDLE) ×1 IMPLANT
NEEDLE HYPO 21X1.5 SAFETY (NEEDLE) ×2 IMPLANT
NS IRRIG 1000ML POUR BTL (IV SOLUTION) ×2 IMPLANT
PACK BASIC III (CUSTOM PROCEDURE TRAY) ×2
PACK SRG BSC III STRL LF ECLPS (CUSTOM PROCEDURE TRAY) ×1 IMPLANT
PAD ARMBOARD 7.5X6 YLW CONV (MISCELLANEOUS) ×2 IMPLANT
PENCIL SMOKE EVACUATOR COATED (MISCELLANEOUS) ×2 IMPLANT
PIN GUIDE THRD AR 3.2X330 (PIN) ×2 IMPLANT
SCREW LOCK CORT 5X36 (Screw) ×1 IMPLANT
SCREW LOCK LAG FEM 10.5X90 (Screw) ×1 IMPLANT
SET BASIN LINEN APH (SET/KITS/TRAYS/PACK) ×2 IMPLANT
SPONGE LAP 18X18 RF (DISPOSABLE) ×4 IMPLANT
STAPLER VISISTAT (STAPLE) IMPLANT
STRIP CLOSURE SKIN 1/2X4 (GAUZE/BANDAGES/DRESSINGS) ×1 IMPLANT
SUT MON AB 2-0 CT1 36 (SUTURE) ×2 IMPLANT
SUT VIC AB 0 CT1 27 (SUTURE) ×2
SUT VIC AB 0 CT1 27XBRD ANTBC (SUTURE) ×1 IMPLANT
SYR 30ML LL (SYRINGE) ×2 IMPLANT
SYR BULB IRRIG 60ML STRL (SYRINGE) ×4 IMPLANT
TOOL ACTIVATION (INSTRUMENTS) ×1 IMPLANT
TRAY FOLEY MTR SLVR 16FR STAT (SET/KITS/TRAYS/PACK) ×2 IMPLANT
YANKAUER SUCT BULB TIP NO VENT (SUCTIONS) ×2 IMPLANT

## 2020-02-19 NOTE — Anesthesia Preprocedure Evaluation (Signed)
Anesthesia Evaluation  Patient identified by MRN, date of birth, ID band Patient awake    Reviewed: Allergy & Precautions, H&P , NPO status , Patient's Chart, lab work & pertinent test results, reviewed documented beta blocker date and time   Airway Mallampati: II  TM Distance: >3 FB Neck ROM: full    Dental no notable dental hx.    Pulmonary shortness of breath,    Pulmonary exam normal breath sounds clear to auscultation       Cardiovascular Exercise Tolerance: Good hypertension, negative cardio ROS   Rhythm:regular Rate:Normal     Neuro/Psych PSYCHIATRIC DISORDERS Anxiety Depression Dementia negative neurological ROS     GI/Hepatic negative GI ROS, Neg liver ROS,   Endo/Other  negative endocrine ROS  Renal/GU negative Renal ROS  negative genitourinary   Musculoskeletal   Abdominal   Peds  Hematology negative hematology ROS (+)   Anesthesia Other Findings   Reproductive/Obstetrics negative OB ROS                             Anesthesia Physical Anesthesia Plan  ASA: III  Anesthesia Plan: Spinal   Post-op Pain Management:    Induction:   PONV Risk Score and Plan: Propofol infusion  Airway Management Planned:   Additional Equipment:   Intra-op Plan:   Post-operative Plan:   Informed Consent: I have reviewed the patients History and Physical, chart, labs and discussed the procedure including the risks, benefits and alternatives for the proposed anesthesia with the patient or authorized representative who has indicated his/her understanding and acceptance.     Dental Advisory Given  Plan Discussed with: CRNA  Anesthesia Plan Comments:         Anesthesia Quick Evaluation

## 2020-02-19 NOTE — Anesthesia Procedure Notes (Signed)
Date/Time: 02/19/2020 1:09 PM Performed by: Franco Nones, CRNA Pre-anesthesia Checklist: Patient identified, Emergency Drugs available, Suction available, Timeout performed and Patient being monitored Patient Re-evaluated:Patient Re-evaluated prior to induction Oxygen Delivery Method: Nasal Cannula

## 2020-02-19 NOTE — Transfer of Care (Signed)
Immediate Anesthesia Transfer of Care Note  Patient: Destiny Lopez  Procedure(s) Performed: Operative fixation left hip (Left Hip)  Patient Location: PACU  Anesthesia Type:Spinal  Level of Consciousness: awake and patient cooperative  Airway & Oxygen Therapy: Patient Spontanous Breathing  Post-op Assessment: Report given to RN and Post -op Vital signs reviewed and stable  Post vital signs: Reviewed and stable  Last Vitals:  Vitals Value Taken Time  BP 122/82 02/19/20 1456  Temp 98   Pulse 72 02/19/20 1458  Resp 14 02/19/20 1458  SpO2 99 % 02/19/20 1458  Vitals shown include unvalidated device data.  Last Pain:  Vitals:   02/19/20 1029  TempSrc: Oral         Complications: No complications documented.

## 2020-02-19 NOTE — Progress Notes (Signed)
Admitted to preop #4. Alert. Confused. Does not communicate. IV infusing without difficulty. Connected to monitors. Mitten to right hand.

## 2020-02-19 NOTE — TOC Initial Note (Signed)
Transition of Care [facility]) - Initial/Assessment Note   Patient Details  Name: Destiny Lopez MRN: [id number] Date of Birth: August 21, 1950  Transition of Care [facility]) CM/SW Contact:    Ewing Schlein, LCSW Phone Number: 02/19/2020, 11:20 AM  Clinical Narrative: Patient is a 70 year old female who was admitted intertrochanteric fracture of femur following a mechanical fall at home. Patient has a history of hypertension and Alzheimer disease.  Assessment completed with husband, Destiny Lopez, as patient has Alzheimer's and is nonverbal. Per husband, patient resides at home with him. Patient is not currently active with [facility] services and does not have any DME at this time. Husband is providing patient's transportation to appointments. Patient takes her medications by the husband crushing them up and mixing them with yogurt. Patient requires moderate assistance with ADLs at baseline. Husband is agreeable to SNF if recommended by PT. FL2 started. TOC awaiting PT evaluation.  Expected Discharge Plan: Skilled Nursing Facility Barriers to Discharge: Continued Medical Work up  Patient Goals and CMS Choice Patient states their goals for this hospitalization and ongoing recovery are:: Get surgery for hip fracture CMS Medicare.gov Compare Post Acute Care list provided to:: Patient Represenative (must comment) Choice offered to / list presented to : Spouse  Expected Discharge Plan and Services Expected Discharge Plan: Skilled Nursing Facility In-house Referral: Clinical Social Work Discharge Planning Services: NA Post Acute Care Choice: Skilled Nursing Facility Living arrangements for the past 2 months: Single Family Home              DME Arranged: N/A DME Agency: NA HH Arranged: NA HH Agency: NA  Prior Living Arrangements/Services Living arrangements for the past 2 months: Single Family Home Lives with:: Spouse Patient language and need for interpreter reviewed:: Yes Do you feel safe going back to the  place where you live?: Yes      Need for Family Participation in Patient Care: Yes (Comment) (Patient has advanced dementia.) Care giver support system in place?: Yes (comment) Criminal Activity/Legal Involvement Pertinent to Current Situation/Hospitalization: No - Comment as needed  Activities of Daily Living Home Assistive Devices/Equipment: None ADL Screening (condition at time of admission) Patient's cognitive ability adequate to safely complete daily activities?: No (h/o dementia) Is the patient deaf or have difficulty hearing?: No Does the patient have difficulty seeing, even when wearing glasses/contacts?: No Does the patient have difficulty concentrating, remembering, or making decisions?: Yes Patient able to express need for assistance with ADLs?: No Does the patient have difficulty dressing or bathing?: Yes Independently performs ADLs?: No Communication: Dependent Is this a change from baseline?: Pre-admission baseline Dressing (OT): Needs assistance Is this a change from baseline?: Pre-admission baseline Grooming: Dependent Is this a change from baseline?: Pre-admission baseline Feeding: Needs assistance Is this a change from baseline?: Pre-admission baseline Bathing: Needs assistance Is this a change from baseline?: Pre-admission baseline Toileting: Needs assistance Is this a change from baseline?: Pre-admission baseline In/Out Bed: Needs assistance Is this a change from baseline?: Pre-admission baseline Walks in Home: Needs assistance Is this a change from baseline?: Pre-admission baseline Does the patient have difficulty walking or climbing stairs?: Yes Weakness of Legs: Left Weakness of Arms/Hands: None  Permission Sought/Granted Permission sought to share information with : Facility Industrial/product designer granted to share information with : Yes, Verbal Permission Granted Permission granted to share info w AGENCY: SNFs  Emotional  Assessment Appearance:: Appears stated age Attitude/Demeanor/Rapport: Unable to Assess Affect (typically observed): Unable to Assess Alcohol / Substance Use: Not  Applicable Psych Involvement: No (comment)  Admission diagnosis:  Fall [W19.XXXA] Closed left hip fracture (HCC) [S72.002A] Fall from slip, trip, or stumble, initial encounter [W01.0XXA] Closed intertrochanteric fracture of left hip, initial encounter [facility]) [S72.142A] Intertrochanteric fracture of femur (HCC) [S72.143A] Early onset Alzheimer's dementia without behavioral disturbance (HCC) [G30.0, F02.80] Patient Active Problem List   Diagnosis Date Noted  . Intertrochanteric fracture of femur (HCC) 02/18/2020  . Alzheimer disease (HCC) 01/18/2018  . Memory disorder 04/10/2013  . 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 Other nonspecific abnormal result of function study of brain and central nervous system 10/04/2012  . Anxiety and depression 11/19/2011  . Dyslipidemia 11/19/2011  . Multinodular goiter 11/19/2011  . Osteopenia 11/19/2011  . Chest pain, unspecified   . Shortness of breath   . Chest pain, unspecified   . Essential hypertension   . Pure hypercholesterolemia    PCP:  [name], MD Pharmacy:   [facility] [phone] - [place], [place] - [address] [place] [address] Phone: [phone] Fax: [phone]  Readmission Risk Interventions No flowsheet data found.

## 2020-02-19 NOTE — Progress Notes (Signed)
#  14 foley inserted. Cloudy yellow urine returned. Foley balloon inflated with water. Foley secured to right thigh. Tolerated well.

## 2020-02-19 NOTE — Progress Notes (Signed)
Talked with Mr Neace. Pt status discussed. Questions answered. Voiced understanding.

## 2020-02-19 NOTE — Anesthesia Procedure Notes (Signed)
Spinal  Patient location during procedure: OR Start time: 02/19/2020 1:18 PM End time: 02/19/2020 1:22 PM Staffing Resident/CRNA: Franco Nones, CRNA Preanesthetic Checklist Completed: patient identified, IV checked, site marked, risks and benefits discussed, surgical consent, monitors and equipment checked, pre-op evaluation and timeout performed Spinal Block Patient position: left lateral decubitus Prep: Betadine Patient monitoring: heart rate, cardiac monitor, continuous pulse ox and blood pressure Approach: left paramedian Location: L3-4 Injection technique: single-shot Needle Needle type: Spinocan  Needle gauge: 24 G Needle length: 9 cm Assessment Sensory level: T6 Additional Notes  ATTEMPTS:1 TRAY ID: [id number] TRAY EXPIRATION DATE: 2021-06-11

## 2020-02-19 NOTE — Progress Notes (Signed)
PROGRESS NOTE   MELISS FLEEK  GHW:[id number] DOB: 1950-10-20 DOA: 02/18/2020 PCP: [name], MD   Chief Complaint  Patient presents with  . Fall   Level of care: Med-Surg  Brief Admission History:  a 70 y.o. female, with history of HTN, HLD, and advanced dementia presents to the ED s/p fall. Patient has advanced dementia and does not communicate at baseline. She does walk slowly around the home. She was doing her normal roaming around the home when the husband heard a "boom" in the back bedroom. He said he found her on the floor, very upset. She was near a recliner and two book shelves, but none of them looked like she had fallen into them. Patient is not able to say if she had any preceding symptoms. Husband reports that she has been eating and drinking normally, has not had any fevers, and has not indicated any pain.  Patient is fully vaccinated and boosted for covid.  Patient is DNR.  Admission XR of femur left shows intertrochanteric fracture.  Pt admitted for further management.     Assessment & Plan:   Principal Problem:   Intertrochanteric fracture of femur (HCC) Active Problems:   Essential hypertension   Alzheimer disease (HCC)  1. Acute left intertrochanteric fracture - after mechanical fall - Pt to OR later today for operative management.  2. Essential hypertension - blood pressure currently stable, resume hydralazine PRN as ordered.  3. Alzheimer's type dementia - She has been restarted on home meds.  Delirium precautions.    DVT prophylaxis:  SCDs Code Status:  DNR  Family Communication: t/c  Disposition: anticipating SNF  Status is: Inpatient  Remains inpatient appropriate because:IV treatments appropriate due to intensity of illness or inability to take PO and Inpatient level of care appropriate due to severity of illness   Dispo: The patient is from: Home              Anticipated d/c is to: SNF              Anticipated d/c date is: 2 days               Patient currently is not medically stable to d/c.   Difficult to place patient Yes  Consultants:   Orthopedics   Procedures:     Antimicrobials:     Subjective: Unable to obtain due to severe dementia  Objective: Vitals:   02/19/20 1215 02/19/20 1230 02/19/20 1245 02/19/20 1300  BP: 139/76 (!) 130/107 (!) 108/56 123/78  Pulse: (!) 103 94 (!) 101 86  Resp: 18 15 (!) 23 15  Temp:      TempSrc:      SpO2: 98% 100% 99% 98%  Weight:      Height:        Intake/Output Summary (Last 24 hours) at 02/19/2020 1450 Last data filed at 02/19/2020 1445 Gross per 24 hour  Intake 1700 ml  Output 200 ml  Net 1500 ml   Filed Weights   02/18/20 2017 02/19/20 0257  Weight: 59 kg 54.8 kg    Examination:  General exam: frail, elderly demented lady, Appears calm and comfortable  Respiratory system: no increased work of breathing. Cardiovascular system: normal S1 & S2 heard. No JVD, murmurs, rubs, gallops or clicks. No pedal edema. Gastrointestinal system: Abdomen is nondistended, soft and nontender. No organomegaly or masses felt. Normal bowel sounds heard. Central nervous system: awake,with dementia,  No focal neurological deficits. Extremities: shortened left leg, good  LE pulses palpated and warm.  Skin: No rashes, lesions or ulcers Psychiatry: Judgement and insight appear poor. Mood & affect appropriate.   Data Reviewed: I have personally reviewed following labs and imaging studies  CBC: Recent Labs  Lab 02/18/20 2126 02/19/20 0622  WBC 12.8* 10.3  HGB 13.3 11.8*  HCT 40.9 37.0  MCV 91.3 93.2  PLT 183 146*    Basic Metabolic Panel: Recent Labs  Lab 02/18/20 2126 02/19/20 0622  NA 140 142  K 4.4 3.7  CL 111 111  CO2 23 24  GLUCOSE 149* 145*  BUN 36* 34*  CREATININE 0.78 0.69  CALCIUM 9.4 8.7*    GFR: Estimated Creatinine Clearance: 55.8 mL/min (by C-G formula based on SCr of 0.69 mg/dL).  Liver Function Tests: Recent Labs  Lab 02/19/20 0622  AST 25  ALT  23  ALKPHOS 56  BILITOT 1.1  PROT 5.8*  ALBUMIN 3.3*    CBG: No results for input(s): GLUCAP in the last 168 hours.  Recent Results (from the past 240 hour(s))  SARS Coronavirus 2 by RT PCR (hospital order, performed in [facility] hospital lab) Nasopharyngeal Nasopharyngeal Swab     Status: None   Collection Time: 02/18/20 10:26 PM   Specimen: Nasopharyngeal Swab  Result Value Ref Range Status   SARS Coronavirus 2 NEGATIVE NEGATIVE Final    Comment: (NOTE) SARS-CoV-2 target nucleic acids are NOT DETECTED.  The SARS-CoV-2 RNA is generally detectable in upper and lower respiratory specimens during the acute phase of infection. The lowest concentration of SARS-CoV-2 viral copies this assay can detect is 250 copies / mL. A negative result does not preclude SARS-CoV-2 infection and should not be used as the sole basis for treatment or other patient management decisions.  A negative result may occur with improper specimen collection / handling, submission of specimen other than nasopharyngeal swab, presence of viral mutation(s) within the areas targeted by this assay, and inadequate number of viral copies (<250 copies / mL). A negative result must be combined with clinical observations, patient history, and epidemiological information.  Fact Sheet for Patients:   BoilerBrush.com.cy  Fact Sheet for Healthcare Providers: https://pope.com/  This test is not yet approved or  cleared by the [place] FDA and has been authorized for detection and/or diagnosis of SARS-CoV-2 by FDA under an Emergency Use Authorization (EUA).  This EUA will remain in effect (meaning this test can be used) for the duration of the COVID-19 declaration under Section 564(b)(1) of the Act, 21 U.S.C. section 360bbb-3(b)(1), unless the authorization is terminated or revoked sooner.  Performed at [facility], [address] [place],  [place] [address]   Surgical PCR screen     Status: None   Collection Time: 02/19/20  3:10 AM   Specimen: Nasal Mucosa; Nasal Swab  Result Value Ref Range Status   MRSA, PCR NEGATIVE NEGATIVE Final   Staphylococcus aureus NEGATIVE NEGATIVE Final    Comment: (NOTE) The Xpert SA Assay (FDA approved for NASAL specimens in patients 40 years of age and older), is one component of a comprehensive surveillance program. It is not intended to diagnose infection nor to guide or monitor treatment. Performed at [facility], [address] [place], [place] [address]      Radiology Studies: DG Chest 1 View  Result Date: 02/18/2020 CLINICAL DATA:  Fall, left hip fracture EXAM: CHEST  1 VIEW COMPARISON:  None. FINDINGS: The heart size and mediastinal contours are within normal limits. Both lungs are  clear. The visualized skeletal structures are unremarkable. IMPRESSION: Negative Electronically Signed   By: Charlett Nose M.D.   On: 02/18/2020 21:03   DG Hip Unilat With Pelvis 2-3 Views Left  Result Date: 02/18/2020 CLINICAL DATA:  Fall, left hip pain EXAM: DG HIP (WITH OR WITHOUT PELVIS) 2-3V LEFT COMPARISON:  None. FINDINGS: There is a left femoral intertrochanteric fracture. Mild varus angulation and displacement of fracture fragments. No subluxation or dislocation. IMPRESSION: Left femoral intertrochanteric fracture. Electronically Signed   By: Charlett Nose M.D.   On: 02/18/2020 21:03   DG FEMUR MIN 2 VIEWS LEFT  Result Date: 02/18/2020 CLINICAL DATA:  Preoperative planning.  Close left hip fracture. EXAM: LEFT FEMUR 2 VIEWS COMPARISON:  Hip radiograph February 18, 2020. FINDINGS: Similar appearance of the left femoral intratrochanteric fracture with mild impaction, varus angulation and displacement of the distal fracture fragments. No distal femur fracture. Soft tissues are unremarkable. IMPRESSION: Unchanged left femoral intratrochanteric fracture. No distal femur fracture. Electronically Signed   By: Maudry Mayhew MD   On: 02/18/2020 23:21    Scheduled Meds: . chlorhexidine  15 mL Mouth/Throat Once   Or  . mouth rinse  15 mL Mouth Rinse Once  . [MAR Hold] donepezil  10 mg Oral QHS  . [MAR Hold] heparin  5,000 Units Subcutaneous Q8H  . [MAR Hold] LORazepam  0.5 mg Oral TID  . [MAR Hold] memantine  28 mg Oral Daily  . [MAR Hold] mirtazapine  45 mg Oral QHS  . [MAR Hold] pravastatin  20 mg Oral q1800   Continuous Infusions: . sodium chloride    . lactated ringers 10 mL/hr at 02/19/20 1257     LOS: 1 day   Time spent: 35 mins   Cyndra Feinberg Laural Benes, MD How to contact the [facility] Attending or Consulting provider 7A - 7P or covering provider during after hours 7P -7A, for this patient?  1. Check the care team in [facility] and look for a) attending/consulting TRH provider listed and b) the [facility] team listed 2. Log into [email or web address] and use [facility]'s universal password to access. If you do not have the password, please contact the hospital operator. 3. Locate the [facility] provider you are looking for under [facility] and page to a number that you can be directly reached. 4. If you still have difficulty reaching the provider, please page the [facility] (Director on Call) for the Hospitalists listed on amion for assistance.  02/19/2020, 2:50 PM

## 2020-02-19 NOTE — Anesthesia Postprocedure Evaluation (Signed)
Anesthesia Post Note  Patient: Destiny Lopez  Procedure(s) Performed: Operative fixation left hip (Left Hip)  Patient location during evaluation: PACU Anesthesia Type: Spinal Level of consciousness: awake Pain management: pain level controlled Vital Signs Assessment: post-procedure vital signs reviewed and stable Respiratory status: spontaneous breathing Cardiovascular status: blood pressure returned to baseline Postop Assessment: no headache Anesthetic complications: no   No complications documented.   Last Vitals:  Vitals:   02/19/20 1615 02/19/20 1627  BP: 122/65 (!) 104/53  Pulse:  78  Resp: (!) 24 16  Temp:    SpO2: 100% 100%    Last Pain:  Vitals:   02/19/20 1615  TempSrc:   PainSc: 0-No pain                 Windell Norfolk

## 2020-02-19 NOTE — Op Note (Signed)
Orthopaedic Surgery Operative Note (CSN: [id number])  Destiny Lopez  October 27, 1950 Date of Surgery: 02/19/2020   Diagnoses:  Left hip intertrochanteric femur fracture; reverse obliquity  Procedure: Cephalomedullary nail for left intertrochanteric femur fracture    Operative Finding Successful completion of the planned procedure.     Post-Op Diagnosis: Same Surgeons:Primary: Oliver Barre, MD Assistants:  Cecile Sheerer Location: [facility] OR ROOM 4 Anesthesia: General with regional anesthesia Antibiotics: Ancef 2 g Tourniquet time: * No tourniquets in log * Estimated Blood Loss: 100 cc Complications: None Specimens: None Implants: Implant Name Type Inv. Item Serial No. Manufacturer Lot No. LRB No. Used Action  [id number]    [employer] STERILE ON SET FROM SPD Left 1 Implanted  10.5x37mm lag screw     STERILE ON SET FROM SPD Left 1 Implanted  5.0 x 36 mm screw    [employer] STERILE ON SET FROM SPD Left 1 Implanted    Indications for Surgery:   Destiny Lopez is a 70 y.o. female with a reverse obliquity left intertrochanteric femur fracture.  Benefits and risks of operative and nonoperative management were discussed prior to surgery with patient/guardian(s) and informed consent form was completed.  Specific risks including infection, need for additional surgery, malunion, nonunion, persistent pain, bleeding and more severe complications associated with anesthesia.  The patient's husband elected to proceed.    Procedure:   The patient was identified properly. Informed consent was obtained and the surgical site was marked. The patient was taken up to suite where a spinal was completed and general anesthesia was induced.  The patient was placed supine on a fracture table and appropriate reduction was obtained and visualized on fluoroscopy prior to the beginning of the procedure.  Timeout was performed before the beginning of the case.  Ancef 2 g dosing was confirmed prior to making  incision.   We made an incision proximal to the greater trochanter and dissected down through the fascia.  We then carefully placed the guidepin, localizing under fluoroscopy.  Once satisfied with the starting point, the entry reamer was used to gain entry into the intramedullary canal.  A ball tip guidewire was then introduced and passed to an appropriate level at the physeal scar of the distal femur and measurement was obtained proximally using fluoroscopy.  We selected a length of nail noted above.  Entry reamer was used but the nail was unreamed.  At this point we placed our nail localizing under fluoroscopy.  The fracture was displacing with introduction of the nail, so we used a ball spike pusher to provided lateral compression.  Once we were satisfied with the alignment, we used the outrigger device to pass a wire and then the cephalo-medullary screw.  The screw was locked proximally to avoid over collapse.  We took final shots at the proximal femur and then used the outrigger device to place one distal interlock screw.  Final pictures were obtained.  The wounds were thoroughly irrigated closed in a multilayer fashion with 0 vicryl, 2-0 monocryl and staples.  Sterile dressings were placed.  The patient was awoken from general anesthesia and taken to the PACU in stable condition without complication.     Post-operative plan:  Weightbearing: The patient will be WBAT on the LLE.   DVT prophylaxis per primary team, no orthopedic contraindications.    Pain control with PRN pain medication preferring oral medicines.   Dressing will be changed on POD #2/3; it can be reinforced as needed Follow up plan:  approximately 2 weeks postop for staple removal.  If the patient will be returning to a nursing facility, staples can be removed around this time and a follow up appointment can be scheduled for 6 weeks after surgery. XR at next visit:  please obtain AP pelvis, and 2 views of the L hip

## 2020-02-19 NOTE — Plan of Care (Signed)

## 2020-02-19 NOTE — Progress Notes (Signed)
Alert. Mouth care done. No peridex given. Unable to communicate with instructions to swish and spit out. Tolerated well.

## 2020-02-19 NOTE — Consult Note (Signed)
ORTHOPAEDIC CONSULTATION  REQUESTING PHYSICIAN: Murlean Iba, MD  ASSESSMENT AND PLAN: 70 y.o. female with the following: Left Hip Intertrochanteric femur fracture  (reverse obliquity)   This patient requires inpatient admission to the hospitalist, to include preoperative clearance and perioperative medical management  - Weight Bearing Status/Activity: NWB Left lower extremity  - Additional recommended labs/tests: Preop Labs: CBC, BMP, PT/INR, Chest XR and EKG  -VTE Prophylaxis: Please hold prior to OR; to resume POD#1 at the discretion of the primary team  - Pain control: Recommend PO pain medications PRN; judicious use of narcotics  - Follow-up plan: F/u 10-14 days postop  -Procedures: Plan for OR once patient has been medically optimized  Plan for Left Hip Cephalomedullary nail   DOS: 02/19/20     Chief Complaint: Left hip pain  HPI: Destiny Lopez is a 70 y.o. female who presented to the ED for evaluation after sustaining a mechanical fall.  She has Left hip pain.  She has baseline dementia.  No family at the bedside.  Per reports, she was at home and sustained an unwitnessed fall.  She ambulates within her home, and was doing so yesterday when her husband heard a bang.  He found her on the floor.  She had pain in her hip. Required assistance from EMS.  No pain elsewhere.  She did not lose consciousness.   Past Medical History:  Diagnosis Date  . Alzheimer disease ([place]) 01/18/2018  . Chest pain, unspecified   . Dysthymic disorder   . Irritable bowel syndrome   . Memory disorder 04/10/2013  . Memory loss   . Other malaise and fatigue   . Pure hypercholesterolemia   . Shortness of breath   . Unspecified essential hypertension    Past Surgical History:  Procedure Laterality Date  . APPENDECTOMY    . CHOLECYSTECTOMY    . TONSILLECTOMY     Social History   Socioeconomic History  . Marital status: Married    Spouse name: [name]  . Number of children: 1   . Years of education: college  . Highest education level: Not on file  Occupational History  . Occupation: RETIRED  Tobacco Use  . Smoking status: Never Smoker  . Smokeless tobacco: Never Used  Substance and Sexual Activity  . Alcohol use: No  . Drug use: No  . Sexual activity: Not on file  Other Topics Concern  . Not on file  Social History Narrative   Patient lives at home with her husband [name])   Education college.   Right handed.   Caffeine un sweet tea.   Social Determinants of Health   Financial Resource Strain: Not on file  Food Insecurity: Not on file  Transportation Needs: Not on file  Physical Activity: Not on file  Stress: Not on file  Social Connections: Not on file   Family History  Problem Relation Age of Onset  . Coronary artery disease Father 8       Died MI age 22  . Alzheimer's disease Mother        Pacemaker  . Coronary artery disease Brother 40       Heart attacks in his 8s   Allergies  Allergen Reactions  . Dexlansoprazole Other (See Comments)  . Sodium Pentobarbital [Pentobarbital Sodium] Nausea And Vomiting   Prior to Admission medications   Medication Sig Start Date End Date Taking? Authorizing Provider  Acetaminophen 650 MG TABS Take 2 tablets by mouth every 8 (eight) hours as needed (  pain).   Yes [provider]  Calcium Citrate (CITRACAL PO) Take by mouth. 632m tabs   Takes 2 tabs daily   Yes [provider]  cetirizine (ZYRTEC) 10 MG tablet Take 10 mg by mouth daily.   Yes [provider]  KLOR-CON M20 20 MEQ tablet Take 20 mEq by mouth daily. 04/05/13  Yes [provider]  Melatonin 5 MG CAPS Take by mouth. Takes one cap at bedtime.   Yes [provider]  meloxicam (MOBIC) 7.5 MG tablet Take 7.5 mg by mouth daily. 08/29/14  Yes [provider]  Memantine HCl-Donepezil HCl (NAMZARIC) 28-10 MG CP24 Take 1 capsule by mouth daily. 02/16/18  Yes WKathrynn Ducking MD  mirtazapine  (REMERON) 45 MG tablet TAKE 1 TABLET (45 MG TOTAL) BY MOUTH AT BEDTIME. 01/25/20  Yes WKathrynn Ducking MD  Multiple Vitamins-Minerals (WOMENS MULTIVITAMIN PLUS PO) Take by mouth.   Yes [provider]  Triamcinolone Acetonide (NASACORT ALLERGY 24HR NA) Place into the nose.    [provider]   DG Chest 1 View  Result Date: 02/18/2020 CLINICAL DATA:  Fall, left hip fracture EXAM: CHEST  1 VIEW COMPARISON:  None. FINDINGS: The heart size and mediastinal contours are within normal limits. Both lungs are clear. The visualized skeletal structures are unremarkable. IMPRESSION: Negative Electronically Signed   By: KRolm BaptiseM.D.   On: 02/18/2020 21:03   DG Hip Unilat With Pelvis 2-3 Views Left  Result Date: 02/18/2020 CLINICAL DATA:  Fall, left hip pain EXAM: DG HIP (WITH OR WITHOUT PELVIS) 2-3V LEFT COMPARISON:  None. FINDINGS: There is a left femoral intertrochanteric fracture. Mild varus angulation and displacement of fracture fragments. No subluxation or dislocation. IMPRESSION: Left femoral intertrochanteric fracture. Electronically Signed   By: KRolm BaptiseM.D.   On: 02/18/2020 21:03   DG FEMUR MIN 2 VIEWS LEFT  Result Date: 02/18/2020 CLINICAL DATA:  Preoperative planning.  Close left hip fracture. EXAM: LEFT FEMUR 2 VIEWS COMPARISON:  Hip radiograph February 18, 2020. FINDINGS: Similar appearance of the left femoral intratrochanteric fracture with mild impaction, varus angulation and displacement of the distal fracture fragments. No distal femur fracture. Soft tissues are unremarkable. IMPRESSION: Unchanged left femoral intratrochanteric fracture. No distal femur fracture. Electronically Signed   By: JDahlia BailiffMD   On: 02/18/2020 23:21   Family History Reviewed and non-contributory, no pertinent history of problems with bleeding or anesthesia    Review of Systems No fevers or chills No shortness of breath No bowel or bladder dysfunction No GI  distress     OBJECTIVE  Vitals: Patient Vitals for the past 8 hrs:  BP Temp Temp src Pulse Resp SpO2 Weight  02/19/20 0556 114/68 98.6 F (37 C) Oral 88 20 95 % --  02/19/20 0257 (!) 138/92 98.3 F (36.8 C) Oral (!) 101 20 100 % 54.8 kg  02/19/20 0200 (!) 155/125 97.8 F (36.6 C) Oral 99 18 100 % --   General: no acute distress Cardiovascular: Extremities are warm Respiratory: No cyanosis, no use of accessory musculature Skin: No lesions in the area of chief complaint  Neurologic: responds to light touch  Psychiatric: Patient is confused, does not answer questions.  Lymphatic: No swelling obvious and reported other than the area involved in the exam below Extremities  LLE: Extremity held in a fixed position.  ROM deferred due to known fracture. Responds to light touch in the sural, saphenous, DP, SP, and plantar nerve distribution. 2+ DP pulse.  Toes are WWP.  Active motion intact in the TA/EHL/GS. RLE: Sensation is intact distally in the sural, saphenous, DP, SP, and plantar nerve distribution. 2+ DP pulse.  Toes are WWP.  Active motion intact in the TA/EHL/GS. Tolerates gentle ROM of the hip.  No pain with axial loading.     Test Results Imaging XR of the Left hip and femur demonstrates a displaced, reverse obliquity Intertrochanteric femur fracture.  Labs cbc Recent Labs    02/18/20 2126 02/19/20 0622  WBC 12.8* 10.3  HGB 13.3 11.8*  HCT 40.9 37.0  PLT 183 146*    Labs inflam No results for input(s): CRP in the last 72 hours.  Invalid input(s): ESR  Labs coag Recent Labs    02/19/20 0622  INR 1.1    Recent Labs    02/18/20 2126 02/19/20 0622  NA 140 142  K 4.4 3.7  CL 111 111  CO2 23 24  GLUCOSE 149* 145*  BUN 36* 34*  CREATININE 0.78 0.69  CALCIUM 9.4 8.7*

## 2020-02-20 ENCOUNTER — Encounter (HOSPITAL_COMMUNITY): Payer: Self-pay | Admitting: Orthopedic Surgery

## 2020-02-20 ENCOUNTER — Inpatient Hospital Stay
Admission: RE | Admit: 2020-02-20 | Discharge: 2020-03-29 | Disposition: A | Discharge status: Home / Self Care | Payer: Medicare PPO | Source: Ambulatory Visit | Attending: Internal Medicine | Admitting: Internal Medicine

## 2020-02-20 DIAGNOSIS — G309 Alzheimer's disease, unspecified: Secondary | ICD-10-CM | POA: Diagnosis not present

## 2020-02-20 DIAGNOSIS — S72145D Nondisplaced intertrochanteric fracture of left femur, subsequent encounter for closed fracture with routine healing: Secondary | ICD-10-CM | POA: Diagnosis not present

## 2020-02-20 DIAGNOSIS — F0280 Dementia in other diseases classified elsewhere without behavioral disturbance: Secondary | ICD-10-CM | POA: Diagnosis not present

## 2020-02-20 DIAGNOSIS — I10 Essential (primary) hypertension: Secondary | ICD-10-CM | POA: Diagnosis not present

## 2020-02-20 LAB — CBC
HCT: 28.8 % — ABNORMAL LOW (ref 36.0–46.0)
Hemoglobin: 8.9 g/dL — ABNORMAL LOW (ref 12.0–15.0)
MCH: 29.4 pg (ref 26.0–34.0)
MCHC: 30.9 g/dL (ref 30.0–36.0)
MCV: 95 fL (ref 80.0–100.0)
Platelets: 112 10*3/uL — ABNORMAL LOW (ref 150–400)
RBC: 3.03 MIL/uL — ABNORMAL LOW (ref 3.87–5.11)
RDW: 13.6 % (ref 11.5–15.5)
WBC: 9.4 10*3/uL (ref 4.0–10.5)
nRBC: 0 % (ref 0.0–0.2)

## 2020-02-20 LAB — BASIC METABOLIC PANEL
Anion gap: 6 (ref 5–15)
BUN: 19 mg/dL (ref 8–23)
CO2: 26 mmol/L (ref 22–32)
Calcium: 8.4 mg/dL — ABNORMAL LOW (ref 8.9–10.3)
Chloride: 109 mmol/L (ref 98–111)
Creatinine, Ser: 0.74 mg/dL (ref 0.44–1.00)
GFR, Estimated: >60 mL/min (ref >60)
Glucose, Bld: 127 mg/dL — ABNORMAL HIGH (ref 70–99)
Potassium: 3.3 mmol/L — ABNORMAL LOW (ref 3.5–5.1)
Sodium: 141 mmol/L (ref 135–145)

## 2020-02-20 MED ORDER — FAMOTIDINE 20 MG PO TABS: 20.0000 mg | ORAL_TABLET | Freq: Every day | ORAL | 1 refills | Status: DC

## 2020-02-20 MED ORDER — ACETAMINOPHEN 650 MG PO TABS: 1.0000 | ORAL_TABLET | Freq: Three times a day (TID) | ORAL | Status: DC

## 2020-02-20 MED ORDER — PRAVASTATIN SODIUM 20 MG PO TABS: 20.0000 mg | ORAL_TABLET | Freq: Every evening | ORAL | Status: DC

## 2020-02-20 MED ORDER — MELOXICAM 7.5 MG PO TABS: 7.5000 mg | ORAL_TABLET | Freq: Every day | ORAL | Status: DC | PRN

## 2020-02-20 MED ORDER — LORAZEPAM 0.5 MG PO TABS: 0.5000 mg | ORAL_TABLET | Freq: Three times a day (TID) | ORAL | 0 refills | Status: DC | PRN

## 2020-02-20 MED ORDER — CEFDINIR 300 MG PO CAPS: 300.0000 mg | ORAL_CAPSULE | Freq: Two times a day (BID) | ORAL | 0 refills | Status: AC

## 2020-02-20 MED ORDER — ASPIRIN EC 81 MG PO TBEC: 81.0000 mg | DELAYED_RELEASE_TABLET | Freq: Two times a day (BID) | ORAL | 0 refills | Status: DC

## 2020-02-20 NOTE — Evaluation (Signed)
Occupational Therapy Evaluation Patient Details Name: Destiny Lopez MRN: [id number] DOB: [birth year] Today's Date: 02/20/2020    History of Present Illness Destiny Lopez  is a 70 y.o. female, with history of HTN, HLD, and advanced dementia presents to the ED s/p fall. Patient has advanced dementia and does not communicate at baseline. She does walk slowly around the home. She was doing her normal roaming around the home when the husband heard a "boom" in the back bedroom. He said he found her on the floor, very upset. She was near a recliner and two book shelves, but none of them looked like she had fallen into them. Patient is not able to say if she had any preceding symptoms. Husband reports that she has been eating and drinking normally, has not had any fevers, and has not indicated any pain.   Clinical Impression   Pt limited by cognitive status and lethargy this date. Pt was able to transfer to chair from EOB with Max A x2 with use of RW. OT co-treating with PT this date. Pt required physical assist for placement of purewick and was unable to communicate and participate verbally in session or history. Pt likely at a level of max A to total assist for ADLs at this time per clinical judgement. Pt recommended for further acute OT at 2x a week for 2 weeks. Recommended d/c to SNF due to level of assist needed.     Follow Up Recommendations  SNF    Equipment Recommendations  None recommended by OT           Precautions / Restrictions Precautions Precautions: Fall Restrictions Weight Bearing Restrictions: Yes LLE Weight Bearing: Weight bearing as tolerated      Mobility Bed Mobility Overal bed mobility: Needs Assistance Bed Mobility: Supine to Sit     Supine to sit: Max assist;+2 for physical assistance     General bed mobility comments: Maximal assist +2 for supine to sit at EOB.    Transfers Overall transfer level: Needs assistance Equipment used: Rolling walker (2  wheeled) Transfers: Stand Pivot Transfers   Stand pivot transfers: Max assist;+2 physical assistance       General transfer comment: EOB to chair using RW with max A +2 assit via co-treat with PT.                                               ADL either performed or assessed with clinical judgement   ADL Overall ADL's : Needs assistance/impaired                         Toilet Transfer: Maximal assistance;+2 for physical assistance;RW Toilet Transfer Details (indicate cue type and reason): Simulated with stand pivot transfer to chair from EOB. Assit x2 needed. Toileting- Clothing Manipulation and Hygiene: Total assistance Toileting - Clothing Manipulation Details (indicate cue type and reason): Total assist to place purewick back in groin region.       General ADL Comments: Pt lethargic and unable to follow one step instructions without physical assist.     Vision Baseline Vision/History:  (Unsure. Eyes closed majority of session.)                  Pertinent Vitals/Pain Pain Assessment: Faces Faces Pain Scale: Hurts even more Pain Location: Likely L hip Pain Intervention(s): Monitored  during session;Repositioned     Hand Dominance  (Pt unable to report.)   Extremity/Trunk Assessment Upper Extremity Assessment Upper Extremity Assessment: Generalized weakness   Lower Extremity Assessment Lower Extremity Assessment: Defer to PT evaluation       Communication Communication Communication: Expressive difficulties;Other (comment);Receptive difficulties (No expressive communication today. Receptive also seems difficult.)   Cognition Arousal/Alertness: Lethargic Behavior During Therapy: Flat affect (Eyes closed much of the time. Lethargic.) Overall Cognitive Status: History of cognitive impairments - at baseline                                 General Comments: History of dementia.                    Home Living  Family/patient expects to be discharged to:: Skilled nursing facility (According to RN) Living Arrangements: Spouse/significant other                               Additional Comments: Hisotry taken from review of documentation due to pt inability to report history.      Prior Functioning/Environment Level of Independence: Needs assistance  Gait / Transfers Assistance Needed: Needs assistance. ADL's / Homemaking Assistance Needed: Husband available for assist per document review. Communication / Swallowing Assistance Needed: Does not verbally communicate.          OT Problem List: Decreased strength;Decreased activity tolerance;Decreased range of motion;Impaired balance (sitting and/or standing);Decreased coordination;Decreased cognition;Decreased safety awareness;Decreased knowledge of use of DME or AE;Pain;Impaired UE functional use      OT Treatment/Interventions: Therapeutic exercise;Self-care/ADL training;DME and/or AE instruction;Therapeutic activities;Cognitive remediation/compensation;Patient/family education;Balance training    OT Goals(Current goals can be found in the care plan section) Acute Rehab OT Goals Patient Stated Goal: Unable to report due to cognitive status. OT Goal Formulation: Patient unable to participate in goal setting Time For Goal Achievement: 03/05/20 Potential to Achieve Goals: Poor  OT Frequency: Min 2X/week   Barriers to D/C: Other (comment) (Level of assist needed not viable at home.)          Co-evaluation PT/OT/SLP Co-Evaluation/Treatment: Yes Reason for Co-Treatment: Complexity of the patient's impairments (multi-system involvement);For patient/therapist safety;To address functional/ADL transfers   OT goals addressed during session: ADL's and self-care                       End of Session Equipment Utilized During Treatment: Rolling walker Nurse Communication:  (Nurse informed that the IV had begun to blead slightly.  Nurse informed pt was left in chair with wash cloth over IV.)  Activity Tolerance: Patient limited by lethargy;Other (comment) (Limited by cognitive status.) Patient left: in chair;with chair alarm set;with call bell/phone within reach  OT Visit Diagnosis: Unsteadiness on feet (R26.81);Repeated falls (R29.6);Muscle weakness (generalized) (M62.81);History of falling (Z91.81);Pain Pain - Right/Left: Left Pain - part of body: Hip                Time: 2130-8657 OT Time Calculation (min): 17 min Charges:  OT General Charges $OT Visit: 1 Visit  Danie Chandler OT, MOT   Danie Chandler 02/20/2020, 10:53 AM

## 2020-02-20 NOTE — Plan of Care (Signed)
  Problem: Acute Rehab OT Goals (only OT should resolve) Goal: Pt. Will Perform Eating Flowsheets (Taken 02/20/2020 1054) Pt Will Perform Eating:  with min assist  sitting  bed level Goal: Pt. Will Perform Grooming Flowsheets (Taken 02/20/2020 1054) Pt Will Perform Grooming:  with min assist  bed level  sitting Goal: Pt. Will Perform Upper Body Dressing Flowsheets (Taken 02/20/2020 1054) Pt Will Perform Upper Body Dressing:  with mod assist  sitting Goal: Pt. Will Perform Lower Body Dressing Flowsheets (Taken 02/20/2020 1054) Pt Will Perform Lower Body Dressing:  with mod assist  bed level  with adaptive equipment Goal: Pt. Will Transfer To Toilet Flowsheets (Taken 02/20/2020 1054) Pt Will Transfer to Toilet:  with mod assist  stand pivot transfer Goal: Pt/Caregiver Will Perform Home Exercise Program Flowsheets (Taken 02/20/2020 1054) Pt/caregiver will Perform Home Exercise Program:  Increased strength  Both right and left upper extremity

## 2020-02-20 NOTE — Progress Notes (Signed)
   ORTHOPAEDIC PROGRESS NOTE  s/p Procedure(s): Left hip cephalomedullary nail for IT fracture  ODS: 02/19/20  SUBJECTIVE: No issues over night.  Pain is well controlled.  Patient does not respond to questioning, no family at the bedside.  OBJECTIVE: PE:  Vitals:   02/20/20 1000 02/20/20 1026  BP: (!) 146/72 114/63  Pulse: (!) 114 (!) 103  Resp: 16 20  Temp: 99.1 F (37.3 C) 98.4 F (36.9 C)  SpO2: 99% 100%    Resting comfortably. Does not respond to questions  Lateral hip incisions with some drainage.  Intact otherwise.  Active motion of the ankle and toes visualized. Toes are warm and well perfused.    ASSESSMENT: Destiny Lopez is a 70 y.o. female stable postop  PLAN: Weightbearing: WBAT LLE Insicional and dressing care: Reinforce dressings as needed; ok to change if needed Orthopedic device(s): None VTE prophylaxis: At discretion of medicine team, no orthopaedic contraindications.  Recommend 81 mg Aspirin BID  Pain control: PO medications PRN, judicious use of narcotics Follow - up plan: 2 weeks; if patient is going to a SNF, dressings can be changed and sutures can be managed at SNF.  If this happens, the patient can follow up in 6 weeks.  Otherwise, we will plan to schedule a follow up in 2 weeks.   Contact information:    Twisha Vanpelt A. Dallas Schimke, MD MS [facility] [address] [place],  [place]  [address] Phone: [phone] Fax: [phone]

## 2020-02-20 NOTE — NC FL2 (Signed)
[place] MEDICAID FL2 LEVEL OF CARE SCREENING TOOL     IDENTIFICATION  Patient Name: Destiny Lopez Birthdate: [birth year] Sex: female Admission Date (Current Location): 02/18/2020  [facility] and [place] Number:  [employer] and Address:  [facility],  [address], [place] [address]      Provider Number: [id number]  Attending Physician Name and Address:  Cleora Fleet, MD  Relative Name and Phone Number:  Shamiya Demeritt (husband) Ph: [phone]    Current Level of Care: Hospital Recommended Level of Care: Skilled Nursing Facility Prior Approval Number:    Date Approved/Denied:   PASRR Number:    Discharge Plan: SNF    Current Diagnoses: Patient Active Problem List   Diagnosis Date Noted  . Intertrochanteric fracture of femur ([facility]) 02/18/2020  . Alzheimer disease ([facility]) 01/18/2018  . Memory disorder 04/10/2013  . 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 Other nonspecific abnormal result of function study of brain and central nervous system 10/04/2012  . Anxiety and depression 11/19/2011  . Dyslipidemia 11/19/2011  . Multinodular goiter 11/19/2011  . Osteopenia 11/19/2011  . Chest pain, unspecified   . Shortness of breath   . Chest pain, unspecified   . Essential hypertension   . Pure hypercholesterolemia     Orientation RESPIRATION BLADDER Height & Weight     Self  Normal Incontinent Weight: 120 lb 13 oz (54.8 kg) Height:  5\' 8"  (172.7 cm)  BEHAVIORAL SYMPTOMS/MOOD NEUROLOGICAL BOWEL NUTRITION STATUS      Incontinent Diet (Dysphagia 1 with nectar thick liquids)  AMBULATORY STATUS COMMUNICATION OF NEEDS Skin   Extensive Assist Non-Verbally (Patient can frequently understand yes/no questions.) Surgical wounds                       Personal Care Assistance Level of Assistance  Bathing,Feeding,Dressing Bathing Assistance: Maximum assistance Feeding assistance:  Independent Dressing Assistance: Maximum assistance     Functional Limitations Info  Sight,Hearing,Speech Sight Info: Adequate Hearing Info: Adequate Speech Info: Impaired (Patient has advanced Alzheimer's, which has affected her speech.)    SPECIAL CARE FACTORS FREQUENCY  PT (By licensed PT)     PT Frequency: 5x weekly              Contractures      Additional Factors Info  Code Status,Allergies,Psychotropic Code Status Info: DNR Allergies Info: Dexlansoprazole; Sodium Pentobarbital (Pentobarbital Sodium) Psychotropic Info: Ativan (lorazepam); Remeron (mirtazapine)         Current Medications (02/20/2020):  This is the current hospital active medication list Current Facility-Administered Medications  Medication Dose Route Frequency Provider Last Rate Last Admin  . 0.9 %  sodium chloride infusion   Intravenous Continuous 04/19/2020, MD 50 mL/hr at 02/19/20 1654 New Bag at 02/19/20 1654  . acetaminophen (TYLENOL) tablet 650 mg  650 mg Oral Q6H PRN 04/18/20, MD   650 mg at 02/20/20 0240   Or  . acetaminophen (TYLENOL) suppository 650 mg  650 mg Rectal Q6H PRN 04/19/20, MD      . ceFAZolin (ANCEF) IVPB 1 g/50 mL premix  1 g Intravenous Q8H Oliver Barre, MD 100 mL/hr at 02/20/20 0547 1 g at 02/20/20 0547  . Chlorhexidine Gluconate Cloth 2 % PADS 6 each  6 each Topical Daily 04/19/20, MD   6 each at 02/20/20 0959  . donepezil (ARICEPT) tablet 10 mg  10 mg Oral QHS 04/19/20 A,  MD   10 mg at 02/19/20 2106  . heparin injection 5,000 Units  5,000 Units Subcutaneous Q8H Oliver Barre, MD   5,000 Units at 02/20/20 0549  . hydrALAZINE (APRESOLINE) injection 10 mg  10 mg Intravenous Q6H PRN Oliver Barre, MD      . LORazepam (ATIVAN) injection 1 mg  1 mg Intravenous QHS PRN Oliver Barre, MD   1 mg at 02/19/20 0119  . LORazepam (ATIVAN) tablet 0.5 mg  0.5 mg Oral TID Oliver Barre, MD   0.5 mg at 02/20/20 0956  . melatonin tablet 6 mg  6 mg Oral QHS  PRN Oliver Barre, MD      . memantine (NAMENDA XR) 24 hr capsule 28 mg  28 mg Oral Daily Oliver Barre, MD   28 mg at 02/20/20 0956  . mirtazapine (REMERON) tablet 45 mg  45 mg Oral QHS Oliver Barre, MD   45 mg at 02/19/20 2107  . morphine 2 MG/ML injection 2 mg  2 mg Intravenous Q2H PRN Oliver Barre, MD      . ondansetron [facility]) tablet 4 mg  4 mg Oral Q6H PRN Oliver Barre, MD       Or  . ondansetron [facility]) injection 4 mg  4 mg Intravenous Q6H PRN Oliver Barre, MD      . oxyCODONE (Oxy IR/ROXICODONE) immediate release tablet 5 mg  5 mg Oral Q4H PRN Oliver Barre, MD   5 mg at 02/19/20 2107  . pravastatin (PRAVACHOL) tablet 20 mg  20 mg Oral q1800 Oliver Barre, MD         Discharge Medications: Please see discharge summary for a list of discharge medications.  Relevant Imaging Results:  Relevant Lab Results:   Additional Information SSN: [id number]. Pt received Moderna vaccines, including booster.  Karn Cassis, LCSW

## 2020-02-20 NOTE — Discharge Instructions (Signed)
Please remove staple in 2 weeks.  Please follow up with orthopedics in 6 weeks.  Continue aspirin 81 mg twice daily for DVT prophylaxis for 30 days

## 2020-02-20 NOTE — Plan of Care (Signed)
  Problem: Acute Rehab PT Goals(only PT should resolve) Goal: Pt Will Go Supine/Side To Sit Outcome: Progressing Flowsheets (Taken 02/20/2020 1234) Pt will go Supine/Side to Sit: with moderate assist Goal: Patient Will Transfer Sit To/From Stand Outcome: Progressing Flowsheets (Taken 02/20/2020 1234) Patient will transfer sit to/from stand: with moderate assist Goal: Pt Will Transfer Bed To Chair/Chair To Bed Outcome: Progressing Flowsheets (Taken 02/20/2020 1234) Pt will Transfer Bed to Chair/Chair to Bed: with mod assist Goal: Pt Will Ambulate Outcome: Progressing Flowsheets (Taken 02/20/2020 1234) Pt will Ambulate:  15 feet  with modified independence  with rolling walker   12:35 PM, 02/20/20 Ocie Bob, MPT Physical Therapist with [facility] [phone] office [phone] mobile phone

## 2020-02-20 NOTE — Progress Notes (Signed)
   02/20/20 1000  Assess: MEWS Score  Temp 99.1 F (37.3 C)  BP (!) 146/72  Pulse Rate (!) 114  Resp 16  SpO2 99 %  O2 Device Room Air  Assess: MEWS Score  MEWS Temp 0  MEWS Systolic 0  MEWS Pulse 2  MEWS RR 0  MEWS LOC 0  MEWS Score 2  MEWS Score Color Yellow  Assess: if the MEWS score is Yellow or Red  Were vital signs taken at a resting state? Yes  Focused Assessment No change from prior assessment  Early Detection of Sepsis Score *See Row Information* Low  MEWS guidelines implemented *See Row Information* Yes  Take Vital Signs  Increase Vital Sign Frequency  Yellow: Q 2hr X 2 then Q 4hr X 2, if remains yellow, continue Q 4hrs  Escalate  MEWS: Escalate Yellow: discuss with charge nurse/RN and consider discussing with provider and RRT  Notify: Charge Nurse/RN  Name of Charge Nurse/RN Notified Yehuda Savannah RN  Date Charge Nurse/RN Notified 02/20/20  Time Charge Nurse/RN Notified 1015  Notify: Provider  Provider Name/Title Laural Benes  Date Provider Notified 02/20/20  Time Provider Notified 1017  Notification Type Page  Notification Reason Other (Comment) (MEWS yellow)

## 2020-02-20 NOTE — Discharge Summary (Signed)
Physician Discharge Summary  Destiny Lopez ZOX:[id number] DOB: 02/12/1950 DOA: 02/18/2020  PCP: Eartha InchBadger, Michael C, MD Orthopedics: Dr. Dallas Schimkeairns  Admit date: 02/18/2020 Discharge date: 02/20/2020  Admitted From:  Home  Disposition:  SNF   Recommendations for Outpatient Follow-up:  1. Follow up with orthopedics in 6 weeks 2. Please remove staples in 2 weeks  Discharge Condition: STABLE   CODE STATUS: DNR   Diet: Dys 1   Brief Hospitalization Summary: Please see all hospital notes, images, labs for full details of the hospitalization. 70 y.o. female, with history of HTN, HLD, and advanced dementia presents to the ED s/p fall. Patient has advanced dementia and does not communicate at baseline. She does walk slowly around the home. She was doing her normal roaming around the home when the husband heard a "boom" in the back bedroom. He said he found her on the floor, very upset. She was near a recliner and two book shelves, but none of them looked like she had fallen into them. Patient is not able to say if she had any preceding symptoms. Husband reports that she has been eating and drinking normally, has not had any fevers, and has not indicated any pain.   Patient is fully vaccinated and boosted for covid.   Patient is DNR.  IN the ED T 98.1, HR 91-122, R 17, BP 127/91 100% Leukocytosis 12.8 Chem panel unremarkable XR femur left shows intertrochanteric fracture Ortho consulted from the ED and plans to see patient in the a.m. EKG shows heart rate 122 sinus tach, QTC 459 Chest x-ray is negative for acute disease Covid negative  Hospital Course  1. Acute left intertrochanteric fracture - after mechanical fall - Pt s/p Cephalomedullary nail for left intertrochanteric femur by Dr. Dallas Schimkeairns on 02/19/20.  Pt will be on aspirin 81 mg BID for 30 days for DVT prophylaxis, dressing changes at SNF, Please remove staples 2 weeks, please follow up with Dr. Dallas Schimkeairns in 6 weeks.  2. Essential hypertension  - blood pressure currently stable resume home meds.   3. Alzheimer's type dementia - She has been restarted on home meds.  Delirium precautions.    DVT prophylaxis:  SCDs Code Status:  DNR  Disposition: anticipating SNF  Status is: Inpatient  Discharge Diagnoses:  Principal Problem:   Intertrochanteric fracture of femur (HCC) Active Problems:   Essential hypertension   Alzheimer disease [facility](HCC)   Discharge Instructions:  Allergies as of 02/20/2020      Reactions   Dexlansoprazole Other (See Comments)   Sodium Pentobarbital [pentobarbital Sodium] Nausea And Vomiting      Medication List    TAKE these medications   Acetaminophen 650 MG Tabs Take 1 tablet (650 mg total) by mouth every 8 (eight) hours. What changed:   how much to take  when to take this  reasons to take this   aspirin EC 81 MG tablet Take 1 tablet (81 mg total) by mouth in the morning and at bedtime. Swallow whole.   cefdinir 300 MG capsule Commonly known as: OMNICEF Take 1 capsule (300 mg total) by mouth 2 (two) times daily for 3 days.   cetirizine 10 MG tablet Commonly known as: ZYRTEC Take 10 mg by mouth daily.   CITRACAL PO Take 600 mg by mouth in the morning and at bedtime.   famotidine 20 MG tablet Commonly known as: PEPCID Take 1 tablet (20 mg total) by mouth daily.   Klor-Con M20 20 MEQ tablet Generic drug: potassium chloride SA Take  20 mEq by mouth daily.   LORazepam 0.5 MG tablet Commonly known as: ATIVAN Take 1 tablet (0.5 mg total) by mouth every 8 (eight) hours as needed for anxiety, sedation or sleep. What changed:   when to take this  reasons to take this   Melatonin 5 MG Caps Take 5 mg by mouth at bedtime.   meloxicam 7.5 MG tablet Commonly known as: MOBIC Take 1 tablet (7.5 mg total) by mouth daily as needed for pain. What changed:   when to take this  reasons to take this   Memantine HCl-Donepezil HCl 28-10 MG Cp24 Commonly known as: Namzaric Take 1 capsule  by mouth daily.   mirtazapine 45 MG tablet Commonly known as: REMERON TAKE 1 TABLET (45 MG TOTAL) BY MOUTH AT BEDTIME.   pravastatin 20 MG tablet Commonly known as: PRAVACHOL Take 1 tablet (20 mg total) by mouth every evening. What changed: when to take this   WOMENS MULTIVITAMIN PLUS PO Take 1 tablet by mouth daily.       Contact information for follow-up providers    Oliver Barre, MD. Schedule an appointment as soon as possible for a visit in 6 week(s).   Specialties: Orthopedic Surgery, Sports Medicine Why: Postop follow up for Xrays Contact information: [address] [place] [address] [phone]            Contact information for after-discharge care    Destination    [facility] .   Service: Skilled Nursing Contact information: [address] [place] [address] [phone]                 Allergies  Allergen Reactions  . Dexlansoprazole Other (See Comments)  . Sodium Pentobarbital [Pentobarbital Sodium] Nausea And Vomiting   Allergies as of 02/20/2020      Reactions   Dexlansoprazole Other (See Comments)   Sodium Pentobarbital [pentobarbital Sodium] Nausea And Vomiting      Medication List    TAKE these medications   Acetaminophen 650 MG Tabs Take 1 tablet (650 mg total) by mouth every 8 (eight) hours. What changed:   how much to take  when to take this  reasons to take this   aspirin EC 81 MG tablet Take 1 tablet (81 mg total) by mouth in the morning and at bedtime. Swallow whole.   cefdinir 300 MG capsule Commonly known as: OMNICEF Take 1 capsule (300 mg total) by mouth 2 (two) times daily for 3 days.   cetirizine 10 MG tablet Commonly known as: ZYRTEC Take 10 mg by mouth daily.   CITRACAL PO Take 600 mg by mouth in the morning and at bedtime.   famotidine 20 MG tablet Commonly known as: PEPCID Take 1 tablet (20 mg total) by mouth daily.   Klor-Con M20 20 MEQ  tablet Generic drug: potassium chloride SA Take 20 mEq by mouth daily.   LORazepam 0.5 MG tablet Commonly known as: ATIVAN Take 1 tablet (0.5 mg total) by mouth every 8 (eight) hours as needed for anxiety, sedation or sleep. What changed:   when to take this  reasons to take this   Melatonin 5 MG Caps Take 5 mg by mouth at bedtime.   meloxicam 7.5 MG tablet Commonly known as: MOBIC Take 1 tablet (7.5 mg total) by mouth daily as needed for pain. What changed:   when to take this  reasons to take this   Memantine HCl-Donepezil HCl 28-10 MG Cp24 Commonly  known as: Namzaric Take 1 capsule by mouth daily.   mirtazapine 45 MG tablet Commonly known as: REMERON TAKE 1 TABLET (45 MG TOTAL) BY MOUTH AT BEDTIME.   pravastatin 20 MG tablet Commonly known as: PRAVACHOL Take 1 tablet (20 mg total) by mouth every evening. What changed: when to take this   WOMENS MULTIVITAMIN PLUS PO Take 1 tablet by mouth daily.       Procedures/Studies: DG Chest 1 View  Result Date: 02/18/2020 CLINICAL DATA:  Fall, left hip fracture EXAM: CHEST  1 VIEW COMPARISON:  None. FINDINGS: The heart size and mediastinal contours are within normal limits. Both lungs are clear. The visualized skeletal structures are unremarkable. IMPRESSION: Negative Electronically Signed   By: Charlett Nose M.D.   On: 02/18/2020 21:03   DG HIP OPERATIVE UNILAT WITH PELVIS LEFT  Result Date: 02/19/2020 CLINICAL DATA:  ORIF. EXAM: OPERATIVE left HIP (WITH PELVIS IF PERFORMED) 6 VIEWS TECHNIQUE: Fluoroscopic spot image(s) were submitted for interpretation post-operatively. COMPARISON:  02/18/2020 FINDINGS: Left intertrochanteric fracture has been fixed with a compression screw and locking intramedullary rod. Satisfactory fracture reduction. Hardware in good position. IMPRESSION: Satisfactory ORIF left intertrochanteric fracture. Electronically Signed   By: Marlan Palau M.D.   On: 02/19/2020 14:54   DG HIP UNILAT WITH PELVIS  2-3 VIEWS LEFT  Result Date: 02/19/2020 CLINICAL DATA:  Left hip fracture. EXAM: DG HIP (WITH OR WITHOUT PELVIS) 2-3V LEFT COMPARISON:  02/18/2020 FINDINGS: Left intertrochanteric fracture has been fixed with compression screw and locking intramedullary rod. Fracture alignment satisfactory. Hardware positioning satisfactory. Gas in the soft tissues from recent surgery. IMPRESSION: Satisfactory ORIF left intertrochanteric fracture. Electronically Signed   By: Marlan Palau M.D.   On: 02/19/2020 16:02   DG Hip Unilat With Pelvis 2-3 Views Left  Result Date: 02/18/2020 CLINICAL DATA:  Fall, left hip pain EXAM: DG HIP (WITH OR WITHOUT PELVIS) 2-3V LEFT COMPARISON:  None. FINDINGS: There is a left femoral intertrochanteric fracture. Mild varus angulation and displacement of fracture fragments. No subluxation or dislocation. IMPRESSION: Left femoral intertrochanteric fracture. Electronically Signed   By: Charlett Nose M.D.   On: 02/18/2020 21:03   DG FEMUR MIN 2 VIEWS LEFT  Result Date: 02/19/2020 CLINICAL DATA:  Status post left hip fracture. EXAM: LEFT FEMUR 2 VIEWS COMPARISON:  February 18, 2020. FINDINGS: Status post surgical internal fixation of intertrochanteric fracture of proximal left femur as well as intramedullary rod fixation of the left femoral shaft. Good alignment of fracture components is noted. Expected postoperative changes are seen in the surrounding soft tissues. IMPRESSION: Status post surgical internal fixation of intertrochanteric fracture of proximal left femur. Electronically Signed   By: Lupita Raider M.D.   On: 02/19/2020 15:57   DG FEMUR MIN 2 VIEWS LEFT  Result Date: 02/18/2020 CLINICAL DATA:  Preoperative planning.  Close left hip fracture. EXAM: LEFT FEMUR 2 VIEWS COMPARISON:  Hip radiograph February 18, 2020. FINDINGS: Similar appearance of the left femoral intratrochanteric fracture with mild impaction, varus angulation and displacement of the distal fracture fragments. No distal  femur fracture. Soft tissues are unremarkable. IMPRESSION: Unchanged left femoral intratrochanteric fracture. No distal femur fracture. Electronically Signed   By: Maudry Mayhew MD   On: 02/18/2020 23:21      Subjective: Pt without complaints. Pt mostly nonverbal.    Discharge Exam: Vitals:   02/20/20 1000 02/20/20 1026  BP: (!) 146/72 114/63  Pulse: (!) 114 (!) 103  Resp: 16 20  Temp: 99.1 F (37.3 C)  98.4 F (36.9 C)  SpO2: 99% 100%   Vitals:   02/20/20 0359 02/20/20 0504 02/20/20 1000 02/20/20 1026  BP: (!) 138/57 101/66 (!) 146/72 114/63  Pulse: 93 95 (!) 114 (!) 103  Resp: 20 20 16 20   Temp: (!) 100.5 F (38.1 C) 99.3 F (37.4 C) 99.1 F (37.3 C) 98.4 F (36.9 C)  TempSrc: Oral Oral Oral Oral  SpO2: 98% 99% 99% 100%  Weight:      Height:       General: Pt is alert, awake, not in acute distress, sitting up in chair.  Cardiovascular: normal S1/S2 +, no rubs, no gallops Respiratory: CTA bilaterally, no wheezing, no rhonchi Abdominal: Soft, NT, ND, bowel sounds + Extremities: wound clean dry and intact.    The results of significant diagnostics from this hospitalization (including imaging, microbiology, ancillary and laboratory) are listed below for reference.     Microbiology: Recent Results (from the past 240 hour(s))  SARS Coronavirus 2 by RT PCR (hospital order, performed in [facility] hospital lab) Nasopharyngeal Nasopharyngeal Swab     Status: None   Collection Time: 02/18/20 10:26 PM   Specimen: Nasopharyngeal Swab  Result Value Ref Range Status   SARS Coronavirus 2 NEGATIVE NEGATIVE Final    Comment: (NOTE) SARS-CoV-2 target nucleic acids are NOT DETECTED.  The SARS-CoV-2 RNA is generally detectable in upper and lower respiratory specimens during the acute phase of infection. The lowest concentration of SARS-CoV-2 viral copies this assay can detect is 250 copies / mL. A negative result does not preclude SARS-CoV-2 infection and should not be used as  the sole basis for treatment or other patient management decisions.  A negative result may occur with improper specimen collection / handling, submission of specimen other than nasopharyngeal swab, presence of viral mutation(s) within the areas targeted by this assay, and inadequate number of viral copies (<250 copies / mL). A negative result must be combined with clinical observations, patient history, and epidemiological information.  Fact Sheet for Patients:   04/17/20  Fact Sheet for Healthcare Providers: BoilerBrush.com.cy  This test is not yet approved or  cleared by the https://pope.com/ FDA and has been authorized for detection and/or diagnosis of SARS-CoV-2 by FDA under an Emergency Use Authorization (EUA).  This EUA will remain in effect (meaning this test can be used) for the duration of the COVID-19 declaration under Section 564(b)(1) of the Act, 21 U.S.C. section 360bbb-3(b)(1), unless the authorization is terminated or revoked sooner.  Performed at [facility], [address] [place], [place]   Surgical PCR screen     Status: None   Collection Time: 02/19/20  3:10 AM   Specimen: Nasal Mucosa; Nasal Swab  Result Value Ref Range Status   MRSA, PCR NEGATIVE NEGATIVE Final   Staphylococcus aureus NEGATIVE NEGATIVE Final    Comment: (NOTE) The Xpert SA Assay (FDA approved for NASAL specimens in patients 74 years of age and older), is one component of a comprehensive surveillance program. It is not intended to diagnose infection nor to guide or monitor treatment. Performed at [facility], [address], [place], [place]      Labs: BNP (last 3 results) No results for input(s): BNP in the last 8760 hours. Basic Metabolic Panel: Recent Labs  Lab 02/18/20 2126 02/19/20 0622 02/20/20 0928  NA 140 142 141  K 4.4 3.7 3.3*  CL 111 111 109  CO2 23 24 26   GLUCOSE 149* 145* 127*  BUN  36* 34* 19  CREATININE 0.78 0.69 0.74  CALCIUM 9.4 8.7* 8.4*   Liver Function Tests: Recent Labs  Lab 02/19/20 0622  AST 25  ALT 23  ALKPHOS 56  BILITOT 1.1  PROT 5.8*  ALBUMIN 3.3*   No results for input(s): LIPASE, AMYLASE in the last 168 hours. No results for input(s): AMMONIA in the last 168 hours. CBC: Recent Labs  Lab 02/18/20 2126 02/19/20 0622 02/20/20 0928  WBC 12.8* 10.3 9.4  HGB 13.3 11.8* 8.9*  HCT 40.9 37.0 28.8*  MCV 91.3 93.2 95.0  PLT 183 146* 112*   Cardiac Enzymes: No results for input(s): CKTOTAL, CKMB, CKMBINDEX, TROPONINI in the last 168 hours. BNP: Invalid input(s): POCBNP CBG: No results for input(s): GLUCAP in the last 168 hours. D-Dimer No results for input(s): DDIMER in the last 72 hours. Hgb A1c No results for input(s): HGBA1C in the last 72 hours. Lipid Profile No results for input(s): CHOL, HDL, LDLCALC, TRIG, CHOLHDL, LDLDIRECT in the last 72 hours. Thyroid function studies No results for input(s): TSH, T4TOTAL, T3FREE, THYROIDAB in the last 72 hours.  Invalid input(s): FREET3 Anemia work up No results for input(s): VITAMINB12, FOLATE, FERRITIN, TIBC, IRON, RETICCTPCT in the last 72 hours. Urinalysis    Component Value Date/Time   COLORURINE YELLOW 02/19/2020 0142   APPEARANCEUR CLOUDY (A) 02/19/2020 0142   LABSPEC 1.028 02/19/2020 0142   PHURINE 5.0 02/19/2020 0142   GLUCOSEU NEGATIVE 02/19/2020 0142   HGBUR MODERATE (A) 02/19/2020 0142   BILIRUBINUR NEGATIVE 02/19/2020 0142   KETONESUR 5 (A) 02/19/2020 0142   PROTEINUR 30 (A) 02/19/2020 0142   NITRITE POSITIVE (A) 02/19/2020 0142   LEUKOCYTESUR NEGATIVE 02/19/2020 0142   Sepsis Labs Invalid input(s): PROCALCITONIN,  WBC,  LACTICIDVEN Microbiology Recent Results (from the past 240 hour(s))  SARS Coronavirus 2 by RT PCR (hospital order, performed in [facility] hospital lab) Nasopharyngeal Nasopharyngeal Swab     Status: None   Collection Time: 02/18/20 10:26 PM    Specimen: Nasopharyngeal Swab  Result Value Ref Range Status   SARS Coronavirus 2 NEGATIVE NEGATIVE Final    Comment: (NOTE) SARS-CoV-2 target nucleic acids are NOT DETECTED.  The SARS-CoV-2 RNA is generally detectable in upper and lower respiratory specimens during the acute phase of infection. The lowest concentration of SARS-CoV-2 viral copies this assay can detect is 250 copies / mL. A negative result does not preclude SARS-CoV-2 infection and should not be used as the sole basis for treatment or other patient management decisions.  A negative result may occur with improper specimen collection / handling, submission of specimen other than nasopharyngeal swab, presence of viral mutation(s) within the areas targeted by this assay, and inadequate number of viral copies (<250 copies / mL). A negative result must be combined with clinical observations, patient history, and epidemiological information.  Fact Sheet for Patients:   BoilerBrush.com.cy  Fact Sheet for Healthcare Providers: https://pope.com/  This test is not yet approved or  cleared by the [place] FDA and has been authorized for detection and/or diagnosis of SARS-CoV-2 by FDA under an Emergency Use Authorization (EUA).  This EUA will remain in effect (meaning this test can be used) for the duration of the COVID-19 declaration under Section 564(b)(1) of the Act, 21 U.S.C. section 360bbb-3(b)(1), unless the authorization is terminated or revoked sooner.  Performed at [facility], [address], [place], [place] [address]   Surgical PCR screen     Status: None   Collection Time: 02/19/20  3:10 AM   Specimen: Nasal Mucosa; Nasal Swab  Result Value Ref Range Status   MRSA, PCR NEGATIVE NEGATIVE Final   Staphylococcus aureus NEGATIVE NEGATIVE Final    Comment: (NOTE) The Xpert SA Assay (FDA approved for NASAL specimens in patients 90 years of age and older),  is one component of a comprehensive surveillance program. It is not intended to diagnose infection nor to guide or monitor treatment. Performed at [facility], [address] [place], [place] [address]     Time coordinating discharge: 35 mins   SIGNED:  Standley Dakins, MD  [facility] 02/20/2020, 1:31 PM How to contact the [facility] Attending or Consulting provider 7A - 7P or covering provider during after hours 7P -7A, for this patient?  1. Check the care team in [facility] and look for a) attending/consulting [facility] provider listed and b) the [facility] team listed 2. Log into [email or web address] and use [facility]'s universal password to access. If you do not have the password, please contact the hospital operator. 3. Locate the [facility] provider you are looking for under [facility] and page to a number that you can be directly reached. 4. If you still have difficulty reaching the provider, please page the [facility] (Director on Call) for the Hospitalists listed on amion for assistance.

## 2020-02-20 NOTE — Evaluation (Signed)
Physical Therapy Evaluation Patient Details Name: Destiny Lopez MRN: [id number] DOB: 1950-06-17 Today's Date: 02/20/2020   History of Present Illness  Destiny Lopez  is a 70 y.o. female, with history of HTN, HLD, and advanced dementia presents to the ED s/p fall. Patient has advanced dementia and does not communicate at baseline. She does walk slowly around the home. She was doing her normal roaming around the home when the husband heard a "boom" in the back bedroom. He said he found her on the floor, very upset. She was near a recliner and two book shelves, but none of them looked like she had fallen into them. Patient is not able to say if she had any preceding symptoms. Husband reports that she has been eating and drinking normally, has not had any fevers, and has not indicated any pain.    Clinical Impression  Patient presents lethargic, but once seated became alert and able to participate with therapy.  Patient demonstrates slow labored movement for sitting up at bedside with c/o increased pain left hip, limited to a few shuffling side steps requiring tactile assistance to move LLE due weakness/pain.  Patient tolerated sitting up in chair after therapy - RN/NT notified.  Patient had bleeding from IV site in left wrist - RN notified.  Patient will benefit from continued physical therapy in hospital and recommended venue below to increase strength, balance, endurance for safe ADLs and gait.     Follow Up Recommendations SNF    Equipment Recommendations  Rolling walker with 5" wheels    Recommendations for Other Services       Precautions / Restrictions Precautions Precautions: Fall Restrictions Weight Bearing Restrictions: Yes LLE Weight Bearing: Weight bearing as tolerated      Mobility  Bed Mobility Overal bed mobility: Needs Assistance Bed Mobility: Supine to Sit     Supine to sit: Max assist;+2 for physical assistance     General bed mobility comments: Maximal assist  +2 for supine to sit at EOB.    Transfers Overall transfer level: Needs assistance Equipment used: Rolling walker (2 wheeled) Transfers: Sit to/from [employer] Sit to Stand: Max assist Stand pivot transfers: Max assist;+2 physical assistance       General transfer comment: slow labored movement, diffiuclty moving LLE due to weakness/pain  Ambulation/Gait Ambulation/Gait assistance: Max assist;+2 physical assistance Gait Distance (Feet): 4 Feet Assistive device: Rolling walker (2 wheeled) Gait Pattern/deviations: Decreased step length - right;Decreased step length - left;Decreased stance time - left;Antalgic;Decreased stride length;Shuffle Gait velocity: slow   General Gait Details: limited to 3-4 slow labored shuffling steps requiring tactile assistance to move LLE due to weakness and pain  Stairs            Wheelchair Mobility    Modified Rankin (Stroke Patients Only)       Balance Overall balance assessment: Needs assistance Sitting-balance support: Feet supported;No upper extremity supported Sitting balance-Leahy Scale: Poor Sitting balance - Comments: fair/poor seated at EOB   Standing balance support: During functional activity;Bilateral upper extremity supported Standing balance-Leahy Scale: Poor Standing balance comment: using RW                             Pertinent Vitals/Pain Pain Assessment: Faces Faces Pain Scale: Hurts even more Pain Location: left hip Pain Descriptors / Indicators: Grimacing;Guarding Pain Intervention(s): Limited activity within patient's tolerance;Monitored during session;Repositioned    Home Living Family/patient expects to be discharged to:: Private  residence Living Arrangements: Spouse/significant other               Additional Comments: Patient is a poor historian and mostly non-verbal    Prior Function Level of Independence: Needs assistance   Gait / Transfers Assistance Needed:  household ambulator with supervision  ADL's / Homemaking Assistance Needed: assisted by family        Hand Dominance   Dominant Hand:  (Pt unable to report.)    Extremity/Trunk Assessment   Upper Extremity Assessment Upper Extremity Assessment: Defer to OT evaluation    Lower Extremity Assessment Lower Extremity Assessment: Generalized weakness;LLE deficits/detail LLE Deficits / Details: grossly -3/5 LLE: Unable to fully assess due to pain LLE Sensation: WNL LLE Coordination: WNL    Cervical / Trunk Assessment Cervical / Trunk Assessment: Normal  Communication   Communication: Expressive difficulties;Other (comment);Receptive difficulties (patient mostly non-verbal)  Cognition Arousal/Alertness: Awake/alert;Lethargic Behavior During Therapy: Flat affect Overall Cognitive Status: History of cognitive impairments - at baseline                                 General Comments: History of dementia.      General Comments      Exercises     Assessment/Plan    PT Assessment Patient needs continued PT services  PT Problem List Decreased strength;Decreased activity tolerance;Decreased balance;Decreased mobility       PT Treatment Interventions DME instruction;Gait training;Stair training;Functional mobility training;Therapeutic activities;Therapeutic exercise;Patient/family education;Balance training    PT Goals (Current goals can be found in the Care Plan section)  Acute Rehab PT Goals Patient Stated Goal: not stated PT Goal Formulation: With patient Time For Goal Achievement: 03/05/20 Potential to Achieve Goals: Good    Frequency Min 3X/week   Barriers to discharge        Co-evaluation PT/OT/SLP Co-Evaluation/Treatment: Yes Reason for Co-Treatment: Complexity of the patient's impairments (multi-system involvement);For patient/therapist safety PT goals addressed during session: Mobility/safety with mobility;Proper use of DME;Balance OT goals  addressed during session: ADL's and self-care       AM-PAC PT "6 Clicks" Mobility  Outcome Measure Help needed turning from your back to your side while in a flat bed without using bedrails?: A Lot Help needed moving from lying on your back to sitting on the side of a flat bed without using bedrails?: A Lot Help needed moving to and from a bed to a chair (including a wheelchair)?: A Lot Help needed standing up from a chair using your arms (e.g., wheelchair or bedside chair)?: A Lot Help needed to walk in hospital room?: A Lot Help needed climbing 3-5 steps with a railing? : Total 6 Click Score: 11    End of Session   Activity Tolerance: Patient tolerated treatment well;Patient limited by fatigue;Patient limited by pain Patient left: in chair;with call bell/phone within reach Nurse Communication: Mobility status PT Visit Diagnosis: Unsteadiness on feet (R26.81);Other abnormalities of gait and mobility (R26.89);Muscle weakness (generalized) (M62.81)    Time: 5916-3846 PT Time Calculation (min) (ACUTE ONLY): 31 min   Charges:   PT Evaluation $PT Eval Moderate Complexity: 1 Mod PT Treatments $Therapeutic Activity: 23-37 mins        12:33 PM, 02/20/20 Ocie Bob, MPT Physical Therapist with [facility] [phone] office [phone] mobile phone

## 2020-02-20 NOTE — TOC Progression Note (Addendum)
Transition of Care [facility]) - Progression Note    Patient Details  Name: Destiny Lopez MRN: [id number] Date of Birth: 07-Oct-1950  Transition of Care [facility]) CM/SW Contact  Karn Cassis, [place] Phone Number: 02/20/2020, 10:54 AM  Clinical Narrative:   LCSW faxed SNF referral to [facility] facilities. Will follow up with bed offers. Started [name] (ref #:[id number] ).     Expected Discharge Plan: Skilled Nursing Facility Barriers to Discharge: Continued Medical Work up  Expected Discharge Plan and Services Expected Discharge Plan: Skilled Nursing Facility In-house Referral: Clinical Social Work Discharge Planning Services: NA Post Acute Care Choice: Skilled Nursing Facility Living arrangements for the past 2 months: Single Family Home                 DME Arranged: N/A DME Agency: NA       HH Arranged: NA HH Agency: NA         Social Determinants of Health (SDOH) Interventions    Readmission Risk Interventions No flowsheet data found.

## 2020-02-21 ENCOUNTER — Non-Acute Institutional Stay (SKILLED_NURSING_FACILITY): Payer: Medicare PPO | Admitting: Adult Health

## 2020-02-21 ENCOUNTER — Encounter: Payer: Self-pay | Admitting: Adult Health

## 2020-02-21 DIAGNOSIS — F0280 Dementia in other diseases classified elsewhere without behavioral disturbance: Secondary | ICD-10-CM

## 2020-02-21 DIAGNOSIS — F0393 Unspecified dementia, unspecified severity, with mood disturbance: Secondary | ICD-10-CM | POA: Insufficient documentation

## 2020-02-21 DIAGNOSIS — J3089 Other allergic rhinitis: Secondary | ICD-10-CM | POA: Diagnosis not present

## 2020-02-21 DIAGNOSIS — F03918 Unspecified dementia, unspecified severity, with other behavioral disturbance: Secondary | ICD-10-CM | POA: Insufficient documentation

## 2020-02-21 DIAGNOSIS — S72145D Nondisplaced intertrochanteric fracture of left femur, subsequent encounter for closed fracture with routine healing: Secondary | ICD-10-CM

## 2020-02-21 DIAGNOSIS — D696 Thrombocytopenia, unspecified: Secondary | ICD-10-CM

## 2020-02-21 DIAGNOSIS — G309 Alzheimer's disease, unspecified: Secondary | ICD-10-CM

## 2020-02-21 DIAGNOSIS — F32A Depression, unspecified: Secondary | ICD-10-CM

## 2020-02-21 DIAGNOSIS — R1319 Other dysphagia: Secondary | ICD-10-CM

## 2020-02-21 DIAGNOSIS — K219 Gastro-esophageal reflux disease without esophagitis: Secondary | ICD-10-CM | POA: Diagnosis not present

## 2020-02-21 DIAGNOSIS — R1310 Dysphagia, unspecified: Secondary | ICD-10-CM | POA: Insufficient documentation

## 2020-02-21 DIAGNOSIS — F0391 Unspecified dementia with behavioral disturbance: Secondary | ICD-10-CM | POA: Insufficient documentation

## 2020-02-21 DIAGNOSIS — D62 Acute posthemorrhagic anemia: Secondary | ICD-10-CM

## 2020-02-21 DIAGNOSIS — E7800 Pure hypercholesterolemia, unspecified: Secondary | ICD-10-CM

## 2020-02-21 DIAGNOSIS — E876 Hypokalemia: Secondary | ICD-10-CM | POA: Insufficient documentation

## 2020-02-21 LAB — URINE CULTURE

## 2020-02-21 NOTE — Progress Notes (Signed)
Location:  [facility] Room Number: 133/P Place of Service:  SNF (31)   CODE STATUS: DNR  Allergies  Allergen Reactions  . Dexlansoprazole Other (See Comments)  . Sodium Pentobarbital [Pentobarbital Sodium] Nausea And Vomiting    Chief Complaint  Patient presents with  . Hospitalization Follow-up    Hospitalization Follow Up     HPI:  She is a 70 year old woman who has been hospitalized from 02-18-20 through 02-20-20.  She has a medical history of dementia; dysphagia;hyperlipidemia.  She suffered a fall at home. She suffered a left femur fracture; with a left cephalomedullary nail done on 02-19-20. She is here for short term rehab. Her family would like to take her home if able. There are indications of left hip pain with tenderness to palpation present. Her appetite is good today. There are no reports of constipation. She will continue to be followed for her chronic illnesses including: Alzheimer's disease:  Chronic non-seasonal allergic rhinitis: GERD without esophagitis     Past Medical History:  Diagnosis Date  . Alzheimer disease (HCC) 01/18/2018  . Chest pain, unspecified   . Dysthymic disorder   . Irritable bowel syndrome   . Memory disorder 04/10/2013  . Memory loss   . Other malaise and fatigue   . Pure hypercholesterolemia   . Shortness of breath   . Unspecified essential hypertension     Past Surgical History:  Procedure Laterality Date  . APPENDECTOMY    . CHOLECYSTECTOMY    . INTRAMEDULLARY (IM) NAIL INTERTROCHANTERIC Left 02/19/2020   Procedure: Operative fixation left hip;  Surgeon: Oliver Barre, MD;  Location: [facility];  Service: Orthopedics;  Laterality: Left;  . TONSILLECTOMY      Social History   Socioeconomic History  . Marital status: Married    Spouse name: [name]  . Number of children: 1  . Years of education: college  . Highest education level: Not on file  Occupational History  . Occupation: RETIRED  Tobacco Use  .  Smoking status: Never Smoker  . Smokeless tobacco: Never Used  Substance and Sexual Activity  . Alcohol use: No  . Drug use: No  . Sexual activity: Not on file  Other Topics Concern  . Not on file  Social History Narrative   Patient lives at home with her husband [name])   Education college.   Right handed.   Caffeine un sweet tea.   Social Determinants of Health   Financial Resource Strain: Not on file  Food Insecurity: Not on file  Transportation Needs: Not on file  Physical Activity: Not on file  Stress: Not on file  Social Connections: Not on file  Intimate Partner Violence: Not on file   Family History  Problem Relation Age of Onset  . Coronary artery disease Father 12       Died MI age 65  . Alzheimer's disease Mother        Pacemaker  . Coronary artery disease Brother 40       Heart attacks in his 13s      VITAL SIGNS BP 106/66   Pulse 96   Temp (!) 97.1 F (36.2 C)   Resp 20   Ht 5\' 8"  (1.727 m)   Wt 120 lb 13 oz (54.8 kg)   SpO2 96%   BMI 18.37 kg/m   Outpatient Encounter Medications as of 02/21/2020  Medication Sig  . Acetaminophen 650 MG TABS Take 1 tablet (650 mg total) by mouth  every 8 (eight) hours.  Marland Kitchen aspirin 81 MG chewable tablet Chew 81 mg by mouth 2 (two) times daily.  Melene Muller ON 03/19/2020] aspirin 81 MG chewable tablet Chew 81 mg by mouth daily.  . Calcium Citrate (CITRACAL PO) Take 600 mg by mouth in the morning and at bedtime.  . cefdinir (OMNICEF) 300 MG capsule Take 1 capsule (300 mg total) by mouth 2 (two) times daily for 3 days.  . cetirizine (ZYRTEC) 10 MG tablet Take 10 mg by mouth daily.  . famotidine (PEPCID) 20 MG tablet Take 1 tablet (20 mg total) by mouth daily.  Marland Kitchen LORazepam (ATIVAN) 0.5 MG tablet Take 0.5 mg by mouth every 12 (twelve) hours as needed for anxiety.  . Melatonin 5 MG CAPS Take 5 mg by mouth at bedtime.  . meloxicam (MOBIC) 7.5 MG tablet Take 1 tablet (7.5 mg total) by mouth daily as needed for pain.  .  Memantine HCl-Donepezil HCl (NAMZARIC) 28-10 MG CP24 Take 1 capsule by mouth daily.  . mirtazapine (REMERON) 45 MG tablet TAKE 1 TABLET (45 MG TOTAL) BY MOUTH AT BEDTIME.  . Multiple Vitamins-Minerals (HIGH POTENCY MULTIVIT/MIN/IRON PO) Take 1 tablet by mouth daily.  . NON FORMULARY Diet: Pureed diet  . potassium chloride 20 MEQ/15ML (10%) SOLN Take 20 mEq by mouth daily.  . pravastatin (PRAVACHOL) 20 MG tablet Take 1 tablet (20 mg total) by mouth every evening.  . [DISCONTINUED] aspirin EC 81 MG tablet Take 1 tablet (81 mg total) by mouth in the morning and at bedtime. Swallow whole.  . [DISCONTINUED] KLOR-CON M20 20 MEQ tablet Take 20 mEq by mouth daily.  . [DISCONTINUED] LORazepam (ATIVAN) 0.5 MG tablet Take 1 tablet (0.5 mg total) by mouth every 8 (eight) hours as needed for anxiety, sedation or sleep.  . [DISCONTINUED] Multiple Vitamins-Minerals (WOMENS MULTIVITAMIN PLUS PO) Take 1 tablet by mouth daily.   No facility-administered encounter medications on file as of 02/21/2020.     SIGNIFICANT DIAGNOSTIC EXAMS  TODAY  02-18-20: chest x-ray: The heart size and mediastinal contours are within normal limits. Both lungs are clear. The visualized skeletal structures are unremarkable.  02-18-20: left hip and pelvic x-ray: Left femoral intertrochanteric fracture.   LABS REVIEWED TODAY  02-18-20: wbc 12.8; hgb 13.3; hct 40.9 mcv 91.3 plt 183; glucose 149; bun 36; creat 0.78; k+ 4.4; na++ 140; ca 9.4 GFR>60 02-19-20: wbc 10.3; hgb 11.8; hct 37.0; mcv 93.2 plt 146; glucose 145; bun 34;  creat 0.69; k+ 3.7; na++ 142; ca 8.7 GFR>60 liver normal albumin  3.3 02-20-20: wbc 9.4; hgb 8.9; hct 28.8 mcv 95.0 plt 112; glucose 127; bun 19; creat 0.74; k+ 3.3 na++ 141; ca 8.4 GFR>60   Review of Systems  Unable to perform ROS: Dementia (unable to participate )    Physical Exam Constitutional:      General: She is not in acute distress.    Appearance: She is underweight and well-nourished. She is not  diaphoretic.  Neck:     Thyroid: No thyromegaly.  Cardiovascular:     Rate and Rhythm: Normal rate and regular rhythm.     Pulses: Normal pulses and intact distal pulses.     Heart sounds: Normal heart sounds.  Pulmonary:     Effort: Pulmonary effort is normal. No respiratory distress.     Breath sounds: Normal breath sounds.  Abdominal:     General: Bowel sounds are normal. There is no distension.     Palpations: Abdomen is soft.  Tenderness: There is no abdominal tenderness.  Musculoskeletal:        General: No edema.     Cervical back: Neck supple.     Right lower leg: No edema.     Left lower leg: No edema.     Comments: Is able to move all extremities Is status po left hip IM nail Area is tender to touch   Lymphadenopathy:     Cervical: No cervical adenopathy.  Skin:    General: Skin is warm and dry.     Comments: Incision line without signs of infection present   Neurological:     Mental Status: She is alert. Mental status is at baseline.  Psychiatric:        Mood and Affect: Mood and affect and mood normal.       ASSESSMENT/ PLAN:  TODAY  1. Closed nondisplaced intertrochanteric fracture of left femur with routine healing subsequent encounter: is stable will continue therapy as directed and will follow up with orthopedics. Will change to tylenol 650 mg every 6 hours; will change to mobic 15 mg daily asa 81 mg twice daily through 02-19-20 then 81 mg daily is omniceg 300 mg twice daily for 3 days   will monitor  2. Alzheimer's disease: is without change weight is 120 pounds. Will continue namzaric 28/10 mg daily will monitor   3. Chronic non-seasonal allergic rhinitis: is stable will continue zyrtec 10 mg daily   4. GERD without esophagitis: is stable will continue pepcid 20 mg daily   5. Hypokalemia: is without change k+ 3.3 will continue k+ 20 meq daily  6. Depression due to dementia: is stable will continue remeron 45 mg nightly and melatonin 5 mg nightly  for sleep; has ativan 0.5 mg twice daily as needed   7. Esophageal dysphagia: no signs of aspiration present will continue pureed diet  8. Thrombocytopenia: is without change plt 112; will monitor and will repeat labs   9. Acute blood loss as cause of postoperative anemia: hgb 8.9 will begin iron daily and will repeat labs.   10. Pure hypercholesterolemia is stable will continue pravachol 20 mg daily    Will check cbc; cmp on 02-26-20    MD is aware of resident's narcotic use and is in agreement with current plan of care. We will attempt to wean resident as appropriate.  Synthia Innocent NP [facility]  Contact [phone] Monday through Friday 8am- 5pm  After hours call [phone]

## 2020-02-23 ENCOUNTER — Non-Acute Institutional Stay (SKILLED_NURSING_FACILITY): Payer: Medicare PPO | Admitting: Internal Medicine

## 2020-02-23 ENCOUNTER — Encounter: Payer: Self-pay | Admitting: Internal Medicine

## 2020-02-23 DIAGNOSIS — D696 Thrombocytopenia, unspecified: Secondary | ICD-10-CM

## 2020-02-23 DIAGNOSIS — D62 Acute posthemorrhagic anemia: Secondary | ICD-10-CM | POA: Diagnosis not present

## 2020-02-23 DIAGNOSIS — R739 Hyperglycemia, unspecified: Secondary | ICD-10-CM | POA: Diagnosis not present

## 2020-02-23 DIAGNOSIS — S72145D Nondisplaced intertrochanteric fracture of left femur, subsequent encounter for closed fracture with routine healing: Secondary | ICD-10-CM | POA: Diagnosis not present

## 2020-02-23 NOTE — Assessment & Plan Note (Addendum)
Monitor fasting blood glucose; if persistently greater than 150,check A1c.

## 2020-02-23 NOTE — Progress Notes (Signed)
   NURSING HOME LOCATION: [facility] ROOM NUMBER:133/P    CODE STATUS: DNR   PCP: [name], MD   This is a comprehensive admission note to [facility] performed on this date less than 30 days from date of admission. Included are preadmission medical/surgical history; reconciled medication list; family history; social history and comprehensive review of systems.  Corrections and additions to the records were documented. Comprehensive physical exam was also performed. Additionally a clinical summary was entered for each active diagnosis pertinent to this admission in the Problem List to enhance continuity of care.  HPI: Patient was hospitalized 2/6-02/20/2020 having sustained a L intertrochanteric fracture in her home in a unobserved, presumed mechanical fall. In the ED she did exhibit tachycardia with a rate of 122; QTc was 459.  Covid screening was negative; she has been fully vaccinated and boosted. Cephalomedullary nailing of intertrochanteric fracture repair was completed 2/7 by Dr Thane Edu.  She was to be on 81 mg aspirin  twice daily for 30 days for DVT prophylaxis. Staple removal was to be in 2 weeks post discharge. She will complete the cefdinir today.  Past medical and surgical history: Includes dyslipidemia, essential hypertension, IBS, GERD, and Alzheimer's disease. Surgeries include cholecystectomy.  Social history: Nondrinker; never smoked.  Family history: Includes Alzheimer's disease and CAD.   Review of systems:  Could not be completed due to dementia.  She was nonverbal during the entire exam except for a monosyllabic "no" when asked if she were having pain.  She could not follow commands.  She began to pick at her mask, pulling it off and attempting to apply it over various parts of her face.  At one point she put the ear straps in her mouth.    Physical exam:  Pertinent or positive findings: Hair is disheveled.  She stares blankly but will  look at the examiner when addressed.Initially she would not open her mouth for exam; but then she spontaneously opened her mouth revealing some malalignment. First and second heart sounds are accentuated and she has a brisk systolic murmur at the base.  Pedal pulses are decreased.  She has limb wasting.  Strength could not be tested as she could not follow commands to oppose my hands.  General appearance:  no acute distress, increased work of breathing is present.   Lymphatic: No lymphadenopathy about the head, neck, axilla. Eyes: No conjunctival inflammation or lid edema is present. There is no scleral icterus. Ears:  External ear exam shows no significant lesions or deformities.   Nose:  External nasal examination shows no deformity or inflammation. Nasal mucosa are pink and moist without lesions, exudates Neck:  No thyromegaly, masses, tenderness noted.    Heart:  Normal rate and regular rhythm without gallop, click, rub.  Lungs: Chest clear to auscultation without wheezes, rhonchi, rales, rubs. Abdomen: Bowel sounds are normal.  Abdomen is soft and nontender with no organomegaly, hernias, masses. GU: Deferred  Extremities:  No cyanosis, clubbing, edema. Neurologic exam: Balance, Rhomberg, finger to nose testing could not be completed due to clinical state Skin: Warm & dry w/o tenting. No significant lesions or rash visualized.  See clinical summary under each active problem in the Problem List with associated updated therapeutic plan

## 2020-02-23 NOTE — Patient Instructions (Signed)
See assessment and plan under each diagnosis in the problem list and acutely for this visit 

## 2020-02-23 NOTE — Assessment & Plan Note (Addendum)
Mild postop thrombocytopenia with platelet count of 112,000.  No active bleeding dyscrasias at this time.Monitor for such.

## 2020-02-23 NOTE — Assessment & Plan Note (Addendum)
PT/OT at SNF as dementia allows. 

## 2020-02-24 NOTE — Assessment & Plan Note (Signed)
Monitor CBC 

## 2020-02-26 ENCOUNTER — Encounter: Payer: Self-pay | Admitting: Adult Health

## 2020-02-26 ENCOUNTER — Non-Acute Institutional Stay (SKILLED_NURSING_FACILITY): Payer: Medicare PPO | Admitting: Adult Health

## 2020-02-26 ENCOUNTER — Encounter (HOSPITAL_COMMUNITY)
Admission: RE | Admit: 2020-02-26 | Discharge: 2020-02-26 | Disposition: A | Discharge status: Home / Self Care | Payer: Medicare PPO | Source: Skilled Nursing Facility | Attending: Adult Health | Admitting: Adult Health

## 2020-02-26 DIAGNOSIS — I10 Essential (primary) hypertension: Secondary | ICD-10-CM | POA: Insufficient documentation

## 2020-02-26 DIAGNOSIS — D62 Acute posthemorrhagic anemia: Secondary | ICD-10-CM

## 2020-02-26 DIAGNOSIS — E43 Unspecified severe protein-calorie malnutrition: Secondary | ICD-10-CM | POA: Diagnosis not present

## 2020-02-26 LAB — COMPREHENSIVE METABOLIC PANEL
ALT: 54 U/L — ABNORMAL HIGH (ref 0–44)
AST: 51 U/L — ABNORMAL HIGH (ref 15–41)
Albumin: 2.7 g/dL — ABNORMAL LOW (ref 3.5–5.0)
Alkaline Phosphatase: 81 U/L (ref 38–126)
Anion gap: 7 (ref 5–15)
BUN: 20 mg/dL (ref 8–23)
CO2: 27 mmol/L (ref 22–32)
Calcium: 9.1 mg/dL (ref 8.9–10.3)
Chloride: 106 mmol/L (ref 98–111)
Creatinine, Ser: 0.72 mg/dL (ref 0.44–1.00)
GFR, Estimated: >60 mL/min (ref >60)
Glucose, Bld: 106 mg/dL — ABNORMAL HIGH (ref 70–99)
Potassium: 4.2 mmol/L (ref 3.5–5.1)
Sodium: 140 mmol/L (ref 135–145)
Total Bilirubin: 1.6 mg/dL — ABNORMAL HIGH (ref 0.3–1.2)
Total Protein: 5.4 g/dL — ABNORMAL LOW (ref 6.5–8.1)

## 2020-02-26 LAB — CBC
HCT: 25 % — ABNORMAL LOW (ref 36.0–46.0)
Hemoglobin: 7.6 g/dL — ABNORMAL LOW (ref 12.0–15.0)
MCH: 29.5 pg (ref 26.0–34.0)
MCHC: 30.4 g/dL (ref 30.0–36.0)
MCV: 96.9 fL (ref 80.0–100.0)
Platelets: 234 10*3/uL (ref 150–400)
RBC: 2.58 MIL/uL — ABNORMAL LOW (ref 3.87–5.11)
RDW: 13.9 % (ref 11.5–15.5)
WBC: 6.1 10*3/uL (ref 4.0–10.5)
nRBC: 0 % (ref 0.0–0.2)

## 2020-02-26 NOTE — Progress Notes (Signed)
Location:  [facility] Room Number: 222 Place of Service:  SNF (31)   CODE STATUS: dnr  Allergies  Allergen Reactions  . Dexlansoprazole Other (See Comments)  . Sodium Pentobarbital [Pentobarbital Sodium] Nausea And Vomiting    Chief Complaint  Patient presents with  . Acute Visit    For lab follow up     HPI:  Her hgb is much lower at 7.6; she has a low albumin at 2.7. she is presently sleeping. She is pale. There are no indications of pain present at this time. Her weight is stable; there are no reports of agitation or anxiety present.   Past Medical History:  Diagnosis Date  . Alzheimer disease (HCC) 01/18/2018  . Chest pain, unspecified   . Dysthymic disorder   . Irritable bowel syndrome   . Memory disorder 04/10/2013  . Memory loss   . Other malaise and fatigue   . Pure hypercholesterolemia   . Shortness of breath   . Unspecified essential hypertension     Past Surgical History:  Procedure Laterality Date  . APPENDECTOMY    . CHOLECYSTECTOMY    . INTRAMEDULLARY (IM) NAIL INTERTROCHANTERIC Left 02/19/2020   Procedure: Operative fixation left hip;  Surgeon: Oliver Barre, MD;  Location: [facility];  Service: Orthopedics;  Laterality: Left;  . TONSILLECTOMY      Social History   Socioeconomic History  . Marital status: Married    Spouse name: [name]  . Number of children: 1  . Years of education: college  . Highest education level: Not on file  Occupational History  . Occupation: RETIRED  Tobacco Use  . Smoking status: Never Smoker  . Smokeless tobacco: Never Used  Substance and Sexual Activity  . Alcohol use: No  . Drug use: No  . Sexual activity: Not on file  Other Topics Concern  . Not on file  Social History Narrative   Patient lives at home with her husband [name])   Education college.   Right handed.   Caffeine un sweet tea.   Social Determinants of Health   Financial Resource Strain: Not on file  Food Insecurity:  Not on file  Transportation Needs: Not on file  Physical Activity: Not on file  Stress: Not on file  Social Connections: Not on file  Intimate Partner Violence: Not on file   Family History  Problem Relation Age of Onset  . Coronary artery disease Father 86       Died MI age 53  . Alzheimer's disease Mother        Pacemaker  . Coronary artery disease Brother 40       Heart attacks in his 14s      VITAL SIGNS BP 115/65   Pulse 63   Temp 98 F (36.7 C)   Ht 5\' 8"  (1.727 m)   Wt 124 lb 3.2 oz (56.3 kg)   BMI 18.88 kg/m   Outpatient Encounter Medications as of 02/26/2020  Medication Sig  . acetaminophen (TYLENOL) 325 MG tablet Take 650 mg by mouth every 6 (six) hours.  02/28/2020 aspirin 81 MG chewable tablet Chew 81 mg by mouth 2 (two) times daily.  Marland Kitchen ON 03/19/2020] aspirin 81 MG chewable tablet Chew 81 mg by mouth daily.  05/19/2020 [place]-Castor Oil (VENELEX) OINT Apply 1 application topically 3 (three) times daily. Special Instructions: Apply to sacrum and bilateral buttocks qshift for prevention. Every Shift  . calcium carbonate (OSCAL) 1500 (600 Ca) MG  TABS tablet Take 1,500 mg by mouth 2 (two) times daily with a meal.  . cetirizine (ZYRTEC) 10 MG tablet Take 10 mg by mouth daily.  . Ensure (ENSURE) Take 237 mLs by mouth 2 (two) times daily between meals.  . famotidine (PEPCID) 20 MG tablet Take 1 tablet (20 mg total) by mouth daily.  Marland Kitchen KP FERROUS SULFATE PO Take 5 mLs by mouth daily in the afternoon.  Marland Kitchen LORazepam (ATIVAN) 0.5 MG tablet Take 0.5 mg by mouth every 12 (twelve) hours as needed for anxiety.  . Melatonin 5 MG CAPS Take 5 mg by mouth at bedtime.  . meloxicam (MOBIC) 15 MG tablet Take 15 mg by mouth daily.  . Memantine HCl-Donepezil HCl (NAMZARIC) 28-10 MG CP24 Take 1 capsule by mouth daily.  . mirtazapine (REMERON) 45 MG tablet TAKE 1 TABLET (45 MG TOTAL) BY MOUTH AT BEDTIME.  . Multiple Vitamins-Minerals (HIGH POTENCY MULTIVIT/MIN/IRON PO) Take 1 tablet by mouth  daily.  . NON FORMULARY Diet Change: Dys 2 (ground), thin liquids  . potassium chloride 20 MEQ/15ML (10%) SOLN Take 20 mEq by mouth daily.  . pravastatin (PRAVACHOL) 20 MG tablet Take 1 tablet (20 mg total) by mouth every evening.   No facility-administered encounter medications on file as of 02/26/2020.     SIGNIFICANT DIAGNOSTIC EXAMS  PREVIOUS   02-18-20: chest x-ray: The heart size and mediastinal contours are within normal limits. Both lungs are clear. The visualized skeletal structures are unremarkable.  02-18-20: left hip and pelvic x-ray: Left femoral intertrochanteric fracture.   NO NEW EXAMS.   LABS REVIEWED PREVIOUS   02-18-20: wbc 12.8; hgb 13.3; hct 40.9 mcv 91.3 plt 183; glucose 149; bun 36; creat 0.78; k+ 4.4; na++ 140; ca 9.4 GFR>60 02-19-20: wbc 10.3; hgb 11.8; hct 37.0; mcv 93.2 plt 146; glucose 145; bun 34;  creat 0.69; k+ 3.7; na++ 142; ca 8.7 GFR>60 liver normal albumin  3.3 02-20-20: wbc 9.4; hgb 8.9; hct 28.8 mcv 95.0 plt 112; glucose 127; bun 19; creat 0.74; k+ 3.3 na++ 141; ca 8.4 GFR>60  TODAY  02-26-20: wbc 6.1; hgb 7.6; hct 25.0; mcv 96.9 plt 234; glucose 106; bun 20; creat 0.72; k+ 4.2; na++ 140; ca 9.1; GFR>60; ast 51; alt 54; total bili 1.6 albumin 2.7    Review of Systems  Unable to perform ROS: Dementia (unable to participate )    Physical Exam Constitutional:      General: She is not in acute distress.    Appearance: She is underweight and well-nourished. She is not diaphoretic.  Neck:     Thyroid: No thyromegaly.  Cardiovascular:     Rate and Rhythm: Normal rate and regular rhythm.     Pulses: Normal pulses and intact distal pulses.     Heart sounds: Normal heart sounds.  Pulmonary:     Effort: Pulmonary effort is normal. No respiratory distress.     Breath sounds: Normal breath sounds.  Abdominal:     General: Bowel sounds are normal. There is no distension.     Palpations: Abdomen is soft.     Tenderness: There is no abdominal tenderness.   Musculoskeletal:        General: No edema.     Cervical back: Neck supple.     Right lower leg: No edema.     Left lower leg: No edema.     Comments:  Is able to move all extremities Is status po left hip IM nail  Lymphadenopathy:  Cervical: No cervical adenopathy.  Skin:    General: Skin is warm and dry.     Comments: Incision line without signs of infection present    Neurological:     Mental Status: She is alert. Mental status is at baseline.  Psychiatric:        Mood and Affect: Mood and affect and mood normal.      ASSESSMENT/ PLAN:  TODAY  1. Protein calorie malnutrition 2. Acute blood loss as a cause of postoperative anemia  Is worse:  Will begin prostat 30 ml twice daily  Will stop mobic Will guaiac stool X 3  Will repeat h/h in the AM  MD is aware of resident's narcotic use and is in agreement with current plan of care. We will attempt to wean resident as appropriate.  Synthia Innocent NP [facility]  Contact [phone] Monday through Friday 8am- 5pm  After hours call [phone]

## 2020-02-27 ENCOUNTER — Other Ambulatory Visit (HOSPITAL_COMMUNITY)
Admission: RE | Admit: 2020-02-27 | Discharge: 2020-02-27 | Disposition: A | Discharge status: Home / Self Care | Payer: Medicare PPO | Source: Skilled Nursing Facility | Attending: Adult Health | Admitting: Adult Health

## 2020-02-27 DIAGNOSIS — D696 Thrombocytopenia, unspecified: Secondary | ICD-10-CM | POA: Diagnosis present

## 2020-02-27 LAB — HEMOGLOBIN AND HEMATOCRIT, BLOOD
HCT: 28 % — ABNORMAL LOW (ref 36.0–46.0)
Hemoglobin: 8.6 g/dL — ABNORMAL LOW (ref 12.0–15.0)

## 2020-02-28 ENCOUNTER — Other Ambulatory Visit (HOSPITAL_COMMUNITY)
Admission: RE | Admit: 2020-02-28 | Discharge: 2020-02-28 | Disposition: A | Discharge status: Home / Self Care | Payer: Medicare PPO | Source: Skilled Nursing Facility | Attending: Adult Health | Admitting: Adult Health

## 2020-02-28 DIAGNOSIS — D649 Anemia, unspecified: Secondary | ICD-10-CM | POA: Diagnosis present

## 2020-02-28 LAB — OCCULT BLOOD X 1 CARD TO LAB, STOOL: Fecal Occult Bld: NEGATIVE

## 2020-02-29 ENCOUNTER — Other Ambulatory Visit (HOSPITAL_COMMUNITY)
Admission: RE | Admit: 2020-02-29 | Discharge: 2020-02-29 | Disposition: A | Discharge status: Home / Self Care | Payer: Medicare PPO | Source: Skilled Nursing Facility | Attending: Adult Health | Admitting: Adult Health

## 2020-02-29 DIAGNOSIS — D649 Anemia, unspecified: Secondary | ICD-10-CM | POA: Insufficient documentation

## 2020-02-29 LAB — OCCULT BLOOD X 1 CARD TO LAB, STOOL: Fecal Occult Bld: NEGATIVE

## 2020-03-01 ENCOUNTER — Other Ambulatory Visit (HOSPITAL_COMMUNITY)
Admission: RE | Admit: 2020-03-01 | Discharge: 2020-03-01 | Disposition: A | Discharge status: Home / Self Care | Payer: Medicare PPO | Source: Skilled Nursing Facility | Attending: Adult Health | Admitting: Adult Health

## 2020-03-01 DIAGNOSIS — D649 Anemia, unspecified: Secondary | ICD-10-CM | POA: Diagnosis present

## 2020-03-01 LAB — OCCULT BLOOD X 1 CARD TO LAB, STOOL: Fecal Occult Bld: NEGATIVE

## 2020-03-05 ENCOUNTER — Ambulatory Visit: Payer: Medicare PPO | Admitting: Orthopedic Surgery

## 2020-03-07 ENCOUNTER — Encounter: Payer: Self-pay | Admitting: Adult Health

## 2020-03-07 ENCOUNTER — Non-Acute Institutional Stay (SKILLED_NURSING_FACILITY): Payer: Medicare PPO | Admitting: Adult Health

## 2020-03-07 DIAGNOSIS — E43 Unspecified severe protein-calorie malnutrition: Secondary | ICD-10-CM

## 2020-03-07 DIAGNOSIS — S72145D Nondisplaced intertrochanteric fracture of left femur, subsequent encounter for closed fracture with routine healing: Secondary | ICD-10-CM

## 2020-03-07 DIAGNOSIS — G309 Alzheimer's disease, unspecified: Secondary | ICD-10-CM | POA: Diagnosis not present

## 2020-03-07 DIAGNOSIS — F0280 Dementia in other diseases classified elsewhere without behavioral disturbance: Secondary | ICD-10-CM

## 2020-03-07 NOTE — Progress Notes (Signed)
Location:  [facility] Room Number: 133/P Place of Service:  SNF (31)   CODE STATUS: DNR  Allergies  Allergen Reactions  . Dexlansoprazole Other (See Comments)  . Sodium Pentobarbital [Pentobarbital Sodium] Nausea And Vomiting    Chief Complaint  Patient presents with  . Acute Visit    Care Plan Meeting     HPI:  We have come together for her care plan meeting. Family present  No BIMS; no mood; she is dependent upon staff for all of her adls including eating. She is incontinent of both bladder and bowel. She has had a fall on 03-02-20 without injury. Weight is 115.2 pounds appetite 25-75%; is taking supplements.  Therapy: is max assist with adls. with transfers and ambulation 5-10 feet unable to climb steps will need to climb steps to get into house; has poor ability to stand.    There are no reports of uncontrolled pain; no reports of agitation; no reports of insomnia or shortness of breath. She will continue to be followed for her chronic illnesses including: Closed nondisplaced intertrochanteric fracture of left femur with routine healing subsequent encounter Protein calorie malnutrition severe Alzheimer's disease  Past Medical History:  Diagnosis Date  . Alzheimer disease (HCC) 01/18/2018  . Chest pain, unspecified   . Dysthymic disorder   . Irritable bowel syndrome   . Memory disorder 04/10/2013  . Memory loss   . Other malaise and fatigue   . Pure hypercholesterolemia   . Shortness of breath   . Unspecified essential hypertension     Past Surgical History:  Procedure Laterality Date  . APPENDECTOMY    . CHOLECYSTECTOMY    . INTRAMEDULLARY (IM) NAIL INTERTROCHANTERIC Left 02/19/2020   Procedure: Operative fixation left hip;  Surgeon: Oliver Barre, MD;  Location: [facility];  Service: Orthopedics;  Laterality: Left;  . TONSILLECTOMY      Social History   Socioeconomic History  . Marital status: Married    Spouse name: [name]  . Number of  children: 1  . Years of education: college  . Highest education level: Not on file  Occupational History  . Occupation: RETIRED  Tobacco Use  . Smoking status: Never Smoker  . Smokeless tobacco: Never Used  Substance and Sexual Activity  . Alcohol use: No  . Drug use: No  . Sexual activity: Not on file  Other Topics Concern  . Not on file  Social History Narrative   Patient lives at home with her husband [name])   Education college.   Right handed.   Caffeine un sweet tea.   Social Determinants of Health   Financial Resource Strain: Not on file  Food Insecurity: Not on file  Transportation Needs: Not on file  Physical Activity: Not on file  Stress: Not on file  Social Connections: Not on file  Intimate Partner Violence: Not on file   Family History  Problem Relation Age of Onset  . Coronary artery disease Father 21       Died MI age 70  . Alzheimer's disease Mother        Pacemaker  . Coronary artery disease Brother 40       Heart attacks in his 49s      VITAL SIGNS BP 112/71   Pulse 93   Temp 98 F (36.7 C)   Resp 20   Ht 5\' 8"  (1.727 m)   Wt 124 lb 3.2 oz (56.3 kg)   SpO2 95%   BMI 18.88  kg/m   Outpatient Encounter Medications as of 03/07/2020  Medication Sig  . acetaminophen (TYLENOL) 325 MG tablet Take 650 mg by mouth every 6 (six) hours.  . Amino Acids-Protein Hydrolys (PRO-STAT SUGAR FREE PO) Take 30 mLs by mouth 2 (two) times daily between meals.  Marland Kitchen aspirin 81 MG chewable tablet Chew 81 mg by mouth 2 (two) times daily.  Melene Muller ON 03/19/2020] aspirin 81 MG chewable tablet Chew 81 mg by mouth daily.  Lucilla Lame [place]-Castor Oil (VENELEX) OINT Apply 1 application topically 3 (three) times daily. Special Instructions: Apply to sacrum and bilateral buttocks qshift for prevention. Every Shift  . calcium carbonate (OSCAL) 1500 (600 Ca) MG TABS tablet Take 1,500 mg by mouth 2 (two) times daily with a meal.  . cetirizine (ZYRTEC) 10 MG tablet Take 10 mg by  mouth daily.  . Ensure (ENSURE) Take 237 mLs by mouth 2 (two) times daily between meals.  . famotidine (PEPCID) 20 MG tablet Take 1 tablet (20 mg total) by mouth daily.  Marland Kitchen KP FERROUS SULFATE PO Take 5 mLs by mouth daily in the afternoon.  . Melatonin 5 MG CAPS Take 5 mg by mouth at bedtime.  . Memantine HCl-Donepezil HCl (NAMZARIC) 28-10 MG CP24 Take 1 capsule by mouth daily.  . mirtazapine (REMERON) 45 MG tablet TAKE 1 TABLET (45 MG TOTAL) BY MOUTH AT BEDTIME.  . Multiple Vitamins-Minerals (HIGH POTENCY MULTIVIT/MIN/IRON PO) Take 1 tablet by mouth daily.  . NON FORMULARY Diet Change: Dys 3 (chopped) diet, continue thin liquids  . potassium chloride 20 MEQ/15ML (10%) SOLN Take 20 mEq by mouth daily.  . pravastatin (PRAVACHOL) 20 MG tablet Take 1 tablet (20 mg total) by mouth every evening.  . [DISCONTINUED] meloxicam (MOBIC) 15 MG tablet Take 15 mg by mouth daily.   No facility-administered encounter medications on file as of 03/07/2020.     SIGNIFICANT DIAGNOSTIC EXAMS   PREVIOUS   02-18-20: chest x-ray: The heart size and mediastinal contours are within normal limits. Both lungs are clear. The visualized skeletal structures are unremarkable.  02-18-20: left hip and pelvic x-ray: Left femoral intertrochanteric fracture.   NO NEW EXAMS.   LABS REVIEWED PREVIOUS   02-18-20: wbc 12.8; hgb 13.3; hct 40.9 mcv 91.3 plt 183; glucose 149; bun 36; creat 0.78; k+ 4.4; na++ 140; ca 9.4 GFR>60 02-19-20: wbc 10.3; hgb 11.8; hct 37.0; mcv 93.2 plt 146; glucose 145; bun 34;  creat 0.69; k+ 3.7; na++ 142; ca 8.7 GFR>60 liver normal albumin  3.3 02-20-20: wbc 9.4; hgb 8.9; hct 28.8 mcv 95.0 plt 112; glucose 127; bun 19; creat 0.74; k+ 3.3 na++ 141; ca 8.4 GFR>60 02-26-20: wbc 6.1; hgb 7.6; hct 25.0; mcv 96.9 plt 234; glucose 106; bun 20; creat 0.72; k+ 4.2; na++ 140; ca 9.1; GFR>60; ast 51; alt 54; total bili 1.6 albumin 2.7   NO NEW LABS.   Review of Systems  Unable to perform ROS: Dementia (unable to  participate )    Physical Exam Constitutional:      General: She is not in acute distress.    Appearance: She is well-developed and well-nourished. She is not diaphoretic.  Neck:     Thyroid: No thyromegaly.  Cardiovascular:     Rate and Rhythm: Normal rate and regular rhythm.     Pulses: Normal pulses and intact distal pulses.     Heart sounds: Normal heart sounds.  Pulmonary:     Effort: Pulmonary effort is normal. No respiratory distress.  Breath sounds: Normal breath sounds.  Abdominal:     General: Bowel sounds are normal. There is no distension.     Palpations: Abdomen is soft.     Tenderness: There is no abdominal tenderness.  Musculoskeletal:        General: No edema.     Cervical back: Neck supple.     Right lower leg: No edema.     Left lower leg: No edema.     Comments: Is able to move all extremities Is status po left hip IM nail   Lymphadenopathy:     Cervical: No cervical adenopathy.  Skin:    General: Skin is warm and dry.     Comments: Incision line without signs of infection present.   Neurological:     Mental Status: She is alert. Mental status is at baseline.  Psychiatric:        Mood and Affect: Mood and affect and mood normal.       ASSESSMENT/ PLAN:  TODAY  1. Closed nondisplaced intertrochanteric fracture of left femur with routine healing subsequent encounter 2. Protein calorie malnutrition severe 3. Alzheimer's disease  Will continue therapy as directed Will continue current medications Will continue current plan of care.  Will continue to monitor her status.  Her goal continues to be to return back home.   MD is aware of resident's narcotic use and is in agreement with current plan of care. We will attempt to wean resident as appropriate.  Synthia Innocent NP [facility]  Contact [phone] Monday through Friday 8am- 5pm  After hours call [phone]

## 2020-03-14 ENCOUNTER — Other Ambulatory Visit: Payer: Self-pay | Admitting: Adult Health

## 2020-03-20 ENCOUNTER — Encounter: Payer: Self-pay | Admitting: Orthopedic Surgery

## 2020-03-20 ENCOUNTER — Ambulatory Visit (INDEPENDENT_AMBULATORY_CARE_PROVIDER_SITE_OTHER): Payer: Medicare PPO | Admitting: Orthopedic Surgery

## 2020-03-20 ENCOUNTER — Inpatient Hospital Stay: Payer: Medicare PPO

## 2020-03-20 VITALS — BP 142/83 | HR 87

## 2020-03-20 DIAGNOSIS — M25552 Pain in left hip: Secondary | ICD-10-CM

## 2020-03-20 DIAGNOSIS — S72145D Nondisplaced intertrochanteric fracture of left femur, subsequent encounter for closed fracture with routine healing: Secondary | ICD-10-CM

## 2020-03-20 NOTE — Progress Notes (Signed)
Orthopaedic Postop Note  Assessment: Destiny Lopez is a 70 y.o. female s/p IM nail for left intertrochanteric femur fracture  DOS: 02/19/20  Plan: ROM as tolerated.  Continue with DVT prophylaxis for at least 6 weeks after surgery WBAT on the operative extremity Monitor surgical incisions, no dressing needed Follow up in 6 weeks; call with any issues   Follow-up: No follow-ups on file. XR at next visit: AP pelvis and Left hip  Subjective:  Chief Complaint  Patient presents with  . Hip Pain    History of Present Illness: Destiny Lopez is a 70 y.o. female who presents following the above stated procedure.  This is her first postop visit.  She was DC to a SNF.  According to her husband, she is improving.  Her pain is better.  She can tolerate more ROM and weight bearing on the LLE.  No issues with the surgical incisions.  Her husband states that he is still planning to bring her home when she is ready, but he is considering a long term living facility.   Review of Systems: No fevers or chills No numbness or tingling No Chest Pain No shortness of breath   Objective: BP (!) 142/83   Pulse 87   Physical Exam:  Elderly female, no acute distress.  Dementia.  Does not respond to questions.  Constantly playing with her mask, wearing over her eyes.  Tolerates gentle range of motion of the hip.  No pain with axial loading. Active motion of the TA and EHL.  Toes are warm and well perfused.  No tenderness overlying the surgical incisions.   IMAGING: I personally ordered and reviewed the following images:  XR of the Left hip/femur and AP pelvis demonstrates stable appearance of the cephalomedullary nail.  There has been no interval displacement.  Can still see the fracture line and some associated comminution.  No acute injury.   Impression: Left intertrochanteric femur fracture is stable position s/p left IM nail.   Destiny Barre, MD 03/20/2020 3:21 PM

## 2020-03-27 ENCOUNTER — Other Ambulatory Visit: Payer: Self-pay | Admitting: Adult Health

## 2020-03-27 ENCOUNTER — Non-Acute Institutional Stay (SKILLED_NURSING_FACILITY): Payer: Medicare PPO | Admitting: Adult Health

## 2020-03-27 ENCOUNTER — Encounter: Payer: Self-pay | Admitting: Adult Health

## 2020-03-27 DIAGNOSIS — F0280 Dementia in other diseases classified elsewhere without behavioral disturbance: Secondary | ICD-10-CM

## 2020-03-27 DIAGNOSIS — S72145D Nondisplaced intertrochanteric fracture of left femur, subsequent encounter for closed fracture with routine healing: Secondary | ICD-10-CM

## 2020-03-27 DIAGNOSIS — G309 Alzheimer's disease, unspecified: Secondary | ICD-10-CM

## 2020-03-27 DIAGNOSIS — D62 Acute posthemorrhagic anemia: Secondary | ICD-10-CM

## 2020-03-27 DIAGNOSIS — E43 Unspecified severe protein-calorie malnutrition: Secondary | ICD-10-CM | POA: Diagnosis not present

## 2020-03-27 MED ORDER — MIRTAZAPINE 45 MG PO TABS
45.0000 mg | ORAL_TABLET | Freq: Every day | ORAL | 0 refills | Status: AC
Start: 2020-03-27 — End: ?

## 2020-03-27 MED ORDER — PRAVASTATIN SODIUM 20 MG PO TABS: 20.0000 mg | ORAL_TABLET | Freq: Every evening | ORAL | 0 refills | Status: DC

## 2020-03-27 MED ORDER — NAMZARIC 28-10 MG PO CP24: 1.0000 | ORAL_CAPSULE | Freq: Every day | ORAL | 0 refills | Status: AC

## 2020-03-27 MED ORDER — POTASSIUM CHLORIDE 20 MEQ/15ML (10%) PO SOLN: 20.0000 meq | Freq: Every day | ORAL | 0 refills | Status: DC

## 2020-03-27 NOTE — Progress Notes (Signed)
Location:   [facility] Room Number: 222 Place of Service:  SNF (31)    CODE STATUS: dnr  Allergies  Allergen Reactions  . Dexlansoprazole Other (See Comments)  . Sodium Pentobarbital [Pentobarbital Sodium] Nausea And Vomiting    Chief Complaint  Patient presents with  . Discharge Note    HPI:  She is being discharge to home with home health for pt/ot. She will need a standard wheelchair. She will need her prescriptions written and will need to follow up with her medical provider. She was hospitalized for a left femur fracture. She was admitted to this facility for short term rehab. She has participated to the best of her ability to improve upon her independence with her adls. She is now able return home with family.   Past Medical History:  Diagnosis Date  . Alzheimer disease (HCC) 01/18/2018  . Chest pain, unspecified   . Dysthymic disorder   . Irritable bowel syndrome   . Memory disorder 04/10/2013  . Memory loss   . Other malaise and fatigue   . Pure hypercholesterolemia   . Shortness of breath   . Unspecified essential hypertension     Past Surgical History:  Procedure Laterality Date  . APPENDECTOMY    . CHOLECYSTECTOMY    . INTRAMEDULLARY (IM) NAIL INTERTROCHANTERIC Left 02/19/2020   Procedure: Operative fixation left hip;  Surgeon: Oliver Barre, MD;  Location: [facility];  Service: Orthopedics;  Laterality: Left;  . TONSILLECTOMY      Social History   Socioeconomic History  . Marital status: Married    Spouse name: [name]  . Number of children: 1  . Years of education: college  . Highest education level: Not on file  Occupational History  . Occupation: RETIRED  Tobacco Use  . Smoking status: Never Smoker  . Smokeless tobacco: Never Used  Substance and Sexual Activity  . Alcohol use: No  . Drug use: No  . Sexual activity: Not on file  Other Topics Concern  . Not on file  Social History Narrative   Patient lives at home with  her husband [name])   Education college.   Right handed.   Caffeine un sweet tea.   Social Determinants of Health   Financial Resource Strain: Not on file  Food Insecurity: Not on file  Transportation Needs: Not on file  Physical Activity: Not on file  Stress: Not on file  Social Connections: Not on file  Intimate Partner Violence: Not on file   Family History  Problem Relation Age of Onset  . Coronary artery disease Father 68       Died MI age 65  . Alzheimer's disease Mother        Pacemaker  . Coronary artery disease Brother 40       Heart attacks in his 38s    VITAL SIGNS BP 119/68   Pulse 72   Temp 98.3 F (36.8 C)   Resp 18   Ht 5\' 8"  (1.727 m)   Wt 115 lb 6.4 oz (52.3 kg)   BMI 17.55 kg/m   Patient's Medications  New Prescriptions   No medications on file  Previous Medications   ACETAMINOPHEN (TYLENOL) 325 MG TABLET    Take 650 mg by mouth every 6 (six) hours.   AMINO ACIDS-PROTEIN HYDROLYS (PRO-STAT SUGAR FREE PO)    Take 30 mLs by mouth 2 (two) times daily between meals.   ASPIRIN 81 MG CHEWABLE TABLET  Chew 81 mg by mouth daily.   BALSAM PERU-CASTOR OIL (VENELEX) OINT    Apply 1 application topically 3 (three) times daily. Special Instructions: Apply to sacrum and bilateral buttocks qshift for prevention. Every Shift   CALCIUM CARBONATE (OSCAL) 1500 (600 CA) MG TABS TABLET    Take 1,500 mg by mouth 2 (two) times daily with a meal.   CETIRIZINE (ZYRTEC) 10 MG TABLET    Take 10 mg by mouth daily.   ENSURE (ENSURE)    Take 237 mLs by mouth 2 (two) times daily between meals.   FAMOTIDINE (PEPCID) 20 MG TABLET    Take 1 tablet (20 mg total) by mouth daily.   KP FERROUS SULFATE PO    Take 5 mLs by mouth daily in the afternoon.   MELATONIN 5 MG CAPS    Take 5 mg by mouth at bedtime.   MEMANTINE HCL-DONEPEZIL HCL (NAMZARIC) 28-10 MG CP24    Take 1 capsule by mouth daily.   MIRTAZAPINE (REMERON) 45 MG TABLET    TAKE 1 TABLET (45 MG TOTAL) BY MOUTH AT BEDTIME.    MULTIPLE VITAMINS-MINERALS (HIGH POTENCY MULTIVIT/MIN/IRON PO)    Take 1 tablet by mouth daily.   NON FORMULARY    Diet Change: Dys 3 (chopped) diet, continue thin liquids   POTASSIUM CHLORIDE 20 MEQ/15ML (10%) SOLN    Take 20 mEq by mouth daily.   PRAVASTATIN (PRAVACHOL) 20 MG TABLET    Take 1 tablet (20 mg total) by mouth every evening.  Modified Medications   No medications on file  Discontinued Medications   No medications on file     SIGNIFICANT DIAGNOSTIC EXAMS   PREVIOUS   02-18-20: chest x-ray: The heart size and mediastinal contours are within normal limits. Both lungs are clear. The visualized skeletal structures are unremarkable.  02-18-20: left hip and pelvic x-ray: Left femoral intertrochanteric fracture.   NO NEW EXAMS.   LABS REVIEWED PREVIOUS   02-18-20: wbc 12.8; hgb 13.3; hct 40.9 mcv 91.3 plt 183; glucose 149; bun 36; creat 0.78; k+ 4.4; na++ 140; ca 9.4 GFR>60 02-19-20: wbc 10.3; hgb 11.8; hct 37.0; mcv 93.2 plt 146; glucose 145; bun 34;  creat 0.69; k+ 3.7; na++ 142; ca 8.7 GFR>60 liver normal albumin  3.3 02-20-20: wbc 9.4; hgb 8.9; hct 28.8 mcv 95.0 plt 112; glucose 127; bun 19; creat 0.74; k+ 3.3 na++ 141; ca 8.4 GFR>60 02-26-20: wbc 6.1; hgb 7.6; hct 25.0; mcv 96.9 plt 234; glucose 106; bun 20; creat 0.72; k+ 4.2; na++ 140; ca 9.1; GFR>60; ast 51; alt 54; total bili 1.6 albumin 2.7   NO NEW LABS.   Review of Systems  Unable to perform ROS: Dementia (unable to participate )    Physical Exam Constitutional:      General: She is not in acute distress.    Appearance: She is well-developed. She is not diaphoretic.  Neck:     Thyroid: No thyromegaly.  Cardiovascular:     Rate and Rhythm: Normal rate and regular rhythm.     Pulses: Normal pulses.     Heart sounds: Normal heart sounds.  Pulmonary:     Effort: Pulmonary effort is normal. No respiratory distress.     Breath sounds: Normal breath sounds.  Abdominal:     General: Bowel sounds are normal. There is  no distension.     Palpations: Abdomen is soft.     Tenderness: There is no abdominal tenderness.  Musculoskeletal:     Cervical back: Neck  supple.     Right lower leg: No edema.     Left lower leg: No edema.     Comments: Is able to move all extremities Is status po left hip IM nail    Lymphadenopathy:     Cervical: No cervical adenopathy.  Skin:    General: Skin is warm and dry.  Neurological:     Mental Status: She is alert. Mental status is at baseline.  Psychiatric:        Mood and Affect: Mood normal.     ASSESSMENT/ PLAN:   Patient is being discharged with the following home health services:  Pt/ot to evaluate and treat as indicated for gait balance strength adl training.   Patient is being discharged with the following durable medical equipment:  Standard wheelchair with elevating leg rests; cushions; anti-tippers; brake extensions to allow her to maintain her current level of independence with her adls which cannot be achieved by cane crutches walker; she is able to propel herself.   Patient has been advised to f/u with their PCP in 1-2 weeks to bring them up to date on their rehab stay.  Social services at facility was responsible for arranging this appointment.  Pt was provided with a 30 day supply of prescriptions for medications and refills must be obtained from their PCP.  For controlled substances, a more limited supply may be provided adequate until PCP appointment only.  A 30 day supply of her prescriptions medications have been sent to [facility] in [place]  Time spent with patient: 35 minutes: medications home health dme.   Synthia Innocent NP [facility]  Contact [phone] Monday through Friday 8am- 5pm  After hours call [phone]

## 2020-04-02 ENCOUNTER — Ambulatory Visit: Payer: Medicare PPO | Admitting: Orthopedic Surgery

## 2020-04-03 ENCOUNTER — Ambulatory Visit: Payer: Medicare PPO | Admitting: Neurology

## 2020-04-26 ENCOUNTER — Other Ambulatory Visit: Payer: Self-pay | Admitting: Adult Health

## 2020-04-27 ENCOUNTER — Other Ambulatory Visit: Payer: Self-pay | Admitting: Adult Health

## 2020-05-07 ENCOUNTER — Ambulatory Visit: Payer: Medicare PPO | Admitting: Orthopedic Surgery

## 2020-05-16 ENCOUNTER — Other Ambulatory Visit: Payer: Self-pay | Admitting: Adult Health

## 2020-06-13 ENCOUNTER — Other Ambulatory Visit: Payer: Self-pay | Admitting: Adult Health

## 2020-06-23 ENCOUNTER — Other Ambulatory Visit: Payer: Self-pay | Admitting: Adult Health

## 2020-06-27 ENCOUNTER — Ambulatory Visit: Payer: Medicare PPO | Admitting: Neurology

## 2020-08-28 ENCOUNTER — Other Ambulatory Visit: Payer: Self-pay

## 2020-08-28 ENCOUNTER — Inpatient Hospital Stay (HOSPITAL_COMMUNITY)
Admission: EM | Admit: 2020-08-28 | Discharge: 2020-09-04 | DRG: 177 | Disposition: A | Discharge status: Home / Self Care | Payer: Medicare PPO | Attending: Internal Medicine | Admitting: Internal Medicine

## 2020-08-28 ENCOUNTER — Emergency Department (HOSPITAL_COMMUNITY): Payer: Medicare PPO

## 2020-08-28 DIAGNOSIS — K219 Gastro-esophageal reflux disease without esophagitis: Secondary | ICD-10-CM | POA: Diagnosis present

## 2020-08-28 DIAGNOSIS — G309 Alzheimer's disease, unspecified: Secondary | ICD-10-CM | POA: Diagnosis present

## 2020-08-28 DIAGNOSIS — F32A Depression, unspecified: Secondary | ICD-10-CM | POA: Diagnosis present

## 2020-08-28 DIAGNOSIS — Z820 Family history of epilepsy and other diseases of the nervous system: Secondary | ICD-10-CM | POA: Diagnosis not present

## 2020-08-28 DIAGNOSIS — R1310 Dysphagia, unspecified: Secondary | ICD-10-CM | POA: Diagnosis present

## 2020-08-28 DIAGNOSIS — K589 Irritable bowel syndrome without diarrhea: Secondary | ICD-10-CM | POA: Diagnosis present

## 2020-08-28 DIAGNOSIS — F419 Anxiety disorder, unspecified: Secondary | ICD-10-CM | POA: Diagnosis present

## 2020-08-28 DIAGNOSIS — E7800 Pure hypercholesterolemia, unspecified: Secondary | ICD-10-CM | POA: Diagnosis present

## 2020-08-28 DIAGNOSIS — Z66 Do not resuscitate: Secondary | ICD-10-CM | POA: Diagnosis present

## 2020-08-28 DIAGNOSIS — M8580 Other specified disorders of bone density and structure, unspecified site: Secondary | ICD-10-CM | POA: Diagnosis present

## 2020-08-28 DIAGNOSIS — F0280 Dementia in other diseases classified elsewhere without behavioral disturbance: Secondary | ICD-10-CM | POA: Diagnosis present

## 2020-08-28 DIAGNOSIS — E43 Unspecified severe protein-calorie malnutrition: Secondary | ICD-10-CM | POA: Diagnosis present

## 2020-08-28 DIAGNOSIS — I10 Essential (primary) hypertension: Secondary | ICD-10-CM | POA: Diagnosis present

## 2020-08-28 DIAGNOSIS — Z681 Body mass index (BMI) 19 or less, adult: Secondary | ICD-10-CM | POA: Diagnosis not present

## 2020-08-28 DIAGNOSIS — Z7189 Other specified counseling: Secondary | ICD-10-CM

## 2020-08-28 DIAGNOSIS — U071 COVID-19: Principal | ICD-10-CM | POA: Diagnosis present

## 2020-08-28 DIAGNOSIS — Z8249 Family history of ischemic heart disease and other diseases of the circulatory system: Secondary | ICD-10-CM | POA: Diagnosis not present

## 2020-08-28 DIAGNOSIS — Z515 Encounter for palliative care: Secondary | ICD-10-CM | POA: Diagnosis not present

## 2020-08-28 DIAGNOSIS — Z7982 Long term (current) use of aspirin: Secondary | ICD-10-CM

## 2020-08-28 DIAGNOSIS — R64 Cachexia: Secondary | ICD-10-CM | POA: Diagnosis present

## 2020-08-28 DIAGNOSIS — Z79890 Hormone replacement therapy (postmenopausal): Secondary | ICD-10-CM

## 2020-08-28 DIAGNOSIS — J1282 Pneumonia due to coronavirus disease 2019: Secondary | ICD-10-CM | POA: Diagnosis present

## 2020-08-28 DIAGNOSIS — R0902 Hypoxemia: Secondary | ICD-10-CM

## 2020-08-28 DIAGNOSIS — F0393 Unspecified dementia, unspecified severity, with mood disturbance: Secondary | ICD-10-CM | POA: Diagnosis present

## 2020-08-28 DIAGNOSIS — Z888 Allergy status to other drugs, medicaments and biological substances status: Secondary | ICD-10-CM | POA: Diagnosis not present

## 2020-08-28 DIAGNOSIS — J189 Pneumonia, unspecified organism: Secondary | ICD-10-CM

## 2020-08-28 DIAGNOSIS — Z79899 Other long term (current) drug therapy: Secondary | ICD-10-CM

## 2020-08-28 DIAGNOSIS — D696 Thrombocytopenia, unspecified: Secondary | ICD-10-CM | POA: Diagnosis present

## 2020-08-28 LAB — CBC WITH DIFFERENTIAL/PLATELET
Abs Immature Granulocytes: 0.06 10*3/uL (ref 0.00–0.07)
Basophils Absolute: 0 10*3/uL (ref 0.0–0.1)
Basophils Relative: 0 %
Eosinophils Absolute: 0 10*3/uL (ref 0.0–0.5)
Eosinophils Relative: 0 %
HCT: 47.3 % — ABNORMAL HIGH (ref 36.0–46.0)
Hemoglobin: 15.4 g/dL — ABNORMAL HIGH (ref 12.0–15.0)
Immature Granulocytes: 1 %
Lymphocytes Relative: 5 %
Lymphs Abs: 0.4 10*3/uL — ABNORMAL LOW (ref 0.7–4.0)
MCH: 29.2 pg (ref 26.0–34.0)
MCHC: 32.6 g/dL (ref 30.0–36.0)
MCV: 89.6 fL (ref 80.0–100.0)
Monocytes Absolute: 0.4 10*3/uL (ref 0.1–1.0)
Monocytes Relative: 4 %
Neutro Abs: 7.1 10*3/uL (ref 1.7–7.7)
Neutrophils Relative %: 90 %
Platelets: 201 10*3/uL (ref 150–400)
RBC: 5.28 MIL/uL — ABNORMAL HIGH (ref 3.87–5.11)
RDW: 14.6 % (ref 11.5–15.5)
WBC: 7.9 10*3/uL (ref 4.0–10.5)
nRBC: 0 % (ref 0.0–0.2)

## 2020-08-28 LAB — FERRITIN: Ferritin: 423 ng/mL — ABNORMAL HIGH (ref 11–307)

## 2020-08-28 LAB — COMPREHENSIVE METABOLIC PANEL
ALT: 37 U/L (ref 0–44)
AST: 51 U/L — ABNORMAL HIGH (ref 15–41)
Albumin: 3.3 g/dL — ABNORMAL LOW (ref 3.5–5.0)
Alkaline Phosphatase: 108 U/L (ref 38–126)
Anion gap: 12 (ref 5–15)
BUN: 41 mg/dL — ABNORMAL HIGH (ref 8–23)
CO2: 25 mmol/L (ref 22–32)
Calcium: 8.9 mg/dL (ref 8.9–10.3)
Chloride: 107 mmol/L (ref 98–111)
Creatinine, Ser: 0.87 mg/dL (ref 0.44–1.00)
GFR, Estimated: >60 mL/min (ref >60)
Glucose, Bld: 129 mg/dL — ABNORMAL HIGH (ref 70–99)
Potassium: 3.3 mmol/L — ABNORMAL LOW (ref 3.5–5.1)
Sodium: 144 mmol/L (ref 135–145)
Total Bilirubin: 1.4 mg/dL — ABNORMAL HIGH (ref 0.3–1.2)
Total Protein: 7.1 g/dL (ref 6.5–8.1)

## 2020-08-28 LAB — LACTATE DEHYDROGENASE: LDH: 227 U/L — ABNORMAL HIGH (ref 98–192)

## 2020-08-28 LAB — LACTIC ACID, PLASMA
Lactic Acid, Venous: 2 mmol/L (ref 0.5–1.9)
Lactic Acid, Venous: 2.7 mmol/L (ref 0.5–1.9)

## 2020-08-28 LAB — RESP PANEL BY RT-PCR (FLU A&B, COVID) ARPGX2
Influenza A by PCR: NEGATIVE
Influenza B by PCR: NEGATIVE
SARS Coronavirus 2 by RT PCR: POSITIVE — AB

## 2020-08-28 LAB — TRIGLYCERIDES: Triglycerides: 81 mg/dL (ref <150)

## 2020-08-28 LAB — PROCALCITONIN: Procalcitonin: 0.68 ng/mL

## 2020-08-28 LAB — FIBRINOGEN: Fibrinogen: >800 mg/dL — ABNORMAL HIGH (ref 210–475)

## 2020-08-28 LAB — C-REACTIVE PROTEIN: CRP: 33.8 mg/dL — ABNORMAL HIGH (ref <1.0)

## 2020-08-28 LAB — D-DIMER, QUANTITATIVE: D-Dimer, Quant: 1.18 ug/mL-FEU — ABNORMAL HIGH (ref 0.00–0.50)

## 2020-08-28 MED ORDER — ASCORBIC ACID 500 MG PO TABS
500.0000 mg | ORAL_TABLET | Freq: Every day | ORAL | Status: DC
  Administered 2020-08-30 – 2020-09-02 (×3): 500 mg via ORAL
  Filled 2020-08-28 (×6): qty 1

## 2020-08-28 MED ORDER — ZINC SULFATE 220 (50 ZN) MG PO CAPS
220.0000 mg | ORAL_CAPSULE | Freq: Every day | ORAL | Status: DC
  Administered 2020-08-30 – 2020-09-02 (×3): 220 mg via ORAL
  Filled 2020-08-28 (×6): qty 1

## 2020-08-28 MED ORDER — MEMANTINE HCL-DONEPEZIL HCL 28-10 MG PO CP24
1.0000 | ORAL_CAPSULE | Freq: Every day | ORAL | Status: DC
Start: 2020-08-28 — End: 2020-08-28

## 2020-08-28 MED ORDER — SODIUM CHLORIDE 0.9 % IV SOLN
500.0000 mg | INTRAVENOUS | Status: AC
  Administered 2020-08-29 – 2020-09-01 (×4): 500 mg via INTRAVENOUS
  Filled 2020-08-28 (×5): qty 500

## 2020-08-28 MED ORDER — ALBUTEROL SULFATE HFA 108 (90 BASE) MCG/ACT IN AERS: 2.0000 | INHALATION_SPRAY | RESPIRATORY_TRACT | Status: DC | PRN

## 2020-08-28 MED ORDER — ONDANSETRON HCL 4 MG/2ML IJ SOLN: 4.0000 mg | Freq: Four times a day (QID) | INTRAMUSCULAR | Status: DC | PRN

## 2020-08-28 MED ORDER — SODIUM CHLORIDE 0.9 % IV SOLN
100.0000 mg | INTRAVENOUS | Status: AC
  Administered 2020-08-28: 100 mg via INTRAVENOUS
  Filled 2020-08-28: qty 20

## 2020-08-28 MED ORDER — SODIUM CHLORIDE 0.9 % IV SOLN
1.0000 g | Freq: Once | INTRAVENOUS | Status: AC
  Administered 2020-08-28: 1 g via INTRAVENOUS
  Filled 2020-08-28: qty 10

## 2020-08-28 MED ORDER — IPRATROPIUM-ALBUTEROL 20-100 MCG/ACT IN AERS: 1.0000 | INHALATION_SPRAY | Freq: Two times a day (BID) | RESPIRATORY_TRACT | Status: DC

## 2020-08-28 MED ORDER — CEFTRIAXONE SODIUM 1 G IJ SOLR
1.0000 g | INTRAMUSCULAR | Status: AC
  Administered 2020-08-29 – 2020-09-01 (×4): 1 g via INTRAVENOUS
  Filled 2020-08-28 (×4): qty 10

## 2020-08-28 MED ORDER — OXYCODONE HCL 5 MG PO TABS: 5.0000 mg | ORAL_TABLET | ORAL | Status: DC | PRN

## 2020-08-28 MED ORDER — MIRTAZAPINE 30 MG PO TABS
45.0000 mg | ORAL_TABLET | Freq: Every day | ORAL | Status: DC
  Administered 2020-08-30 – 2020-09-03 (×4): 45 mg via ORAL
  Filled 2020-08-28 (×4): qty 1

## 2020-08-28 MED ORDER — BARICITINIB 2 MG PO TABS
2.0000 mg | ORAL_TABLET | Freq: Every day | ORAL | Status: DC
  Administered 2020-08-30: 2 mg via ORAL
  Filled 2020-08-28 (×3): qty 1

## 2020-08-28 MED ORDER — SODIUM CHLORIDE 0.9 % IV SOLN
500.0000 mg | Freq: Once | INTRAVENOUS | Status: AC
  Administered 2020-08-28: 500 mg via INTRAVENOUS
  Filled 2020-08-28: qty 500

## 2020-08-28 MED ORDER — HYDROCOD POLST-CPM POLST ER 10-8 MG/5ML PO SUER
5.0000 mL | Freq: Two times a day (BID) | ORAL | Status: DC
Start: 2020-08-28 — End: 2020-09-04
  Administered 2020-08-30 – 2020-09-03 (×7): 5 mL via ORAL
  Filled 2020-08-28 (×9): qty 5

## 2020-08-28 MED ORDER — HEPARIN SODIUM (PORCINE) 5000 UNIT/ML IJ SOLN
5000.0000 [IU] | Freq: Three times a day (TID) | INTRAMUSCULAR | Status: DC
  Administered 2020-08-28 – 2020-09-03 (×18): 5000 [IU] via SUBCUTANEOUS
  Filled 2020-08-28 (×18): qty 1

## 2020-08-28 MED ORDER — POLYETHYLENE GLYCOL 3350 17 G PO PACK: 17.0000 g | PACK | Freq: Every day | ORAL | Status: DC | PRN

## 2020-08-28 MED ORDER — TRAZODONE HCL 50 MG PO TABS: 50.0000 mg | ORAL_TABLET | Freq: Every evening | ORAL | Status: DC | PRN

## 2020-08-28 MED ORDER — LORATADINE 10 MG PO TABS
10.0000 mg | ORAL_TABLET | Freq: Every day | ORAL | Status: DC
  Administered 2020-08-30: 10 mg via ORAL
  Filled 2020-08-28 (×3): qty 1

## 2020-08-28 MED ORDER — DONEPEZIL HCL 5 MG PO TABS
10.0000 mg | ORAL_TABLET | Freq: Every day | ORAL | Status: DC
  Administered 2020-08-30 – 2020-09-03 (×4): 10 mg via ORAL
  Filled 2020-08-28 (×5): qty 2

## 2020-08-28 MED ORDER — MEMANTINE HCL ER 28 MG PO CP24
28.0000 mg | ORAL_CAPSULE | Freq: Every day | ORAL | Status: DC
  Administered 2020-08-30 – 2020-09-02 (×3): 28 mg via ORAL
  Filled 2020-08-28 (×10): qty 1

## 2020-08-28 MED ORDER — METHYLPREDNISOLONE SODIUM SUCC 125 MG IJ SOLR
1.0000 mg/kg | Freq: Two times a day (BID) | INTRAMUSCULAR | Status: DC
Start: 2020-08-28 — End: 2020-08-30
  Administered 2020-08-28 – 2020-08-30 (×4): 52.5 mg via INTRAVENOUS
  Filled 2020-08-28 (×4): qty 2

## 2020-08-28 MED ORDER — PREDNISONE 20 MG PO TABS: 50.0000 mg | ORAL_TABLET | Freq: Every day | ORAL | Status: DC

## 2020-08-28 MED ORDER — ONDANSETRON HCL 4 MG PO TABS: 4.0000 mg | ORAL_TABLET | Freq: Four times a day (QID) | ORAL | Status: DC | PRN

## 2020-08-28 MED ORDER — DEXTROSE-NACL 5-0.2 % IV SOLN: INTRAVENOUS | Status: DC

## 2020-08-28 MED ORDER — GUAIFENESIN-DM 100-10 MG/5ML PO SYRP
10.0000 mL | ORAL_SOLUTION | Freq: Three times a day (TID) | ORAL | Status: DC
  Administered 2020-08-30 – 2020-09-04 (×10): 10 mL via ORAL
  Filled 2020-08-28 (×13): qty 10

## 2020-08-28 MED ORDER — ACETAMINOPHEN 325 MG PO TABS: 650.0000 mg | ORAL_TABLET | Freq: Four times a day (QID) | ORAL | Status: DC | PRN

## 2020-08-28 MED ORDER — SODIUM CHLORIDE 0.9 % IV SOLN: 200.0000 mg | Freq: Once | INTRAVENOUS | Status: DC

## 2020-08-28 MED ORDER — IPRATROPIUM-ALBUTEROL 20-100 MCG/ACT IN AERS
1.0000 | INHALATION_SPRAY | Freq: Four times a day (QID) | RESPIRATORY_TRACT | Status: DC
  Administered 2020-08-28: 1 via RESPIRATORY_TRACT
  Filled 2020-08-28: qty 4

## 2020-08-28 MED ORDER — MELOXICAM 7.5 MG PO TABS
7.5000 mg | ORAL_TABLET | Freq: Every day | ORAL | Status: DC
  Administered 2020-08-30 – 2020-09-02 (×3): 7.5 mg via ORAL
  Filled 2020-08-28 (×8): qty 1

## 2020-08-28 MED ORDER — SODIUM CHLORIDE 0.9 % IV SOLN
100.0000 mg | Freq: Every day | INTRAVENOUS | Status: AC
  Administered 2020-08-29 – 2020-09-01 (×4): 100 mg via INTRAVENOUS
  Filled 2020-08-28 (×4): qty 20

## 2020-08-28 MED ORDER — SODIUM CHLORIDE 0.9 % IV SOLN: 100.0000 mg | Freq: Every day | INTRAVENOUS | Status: DC

## 2020-08-28 NOTE — ED Notes (Signed)
Per family pt with advanced dementia, at baseline pulls at clothing, confused, covid + one week ago, this morning difficult to rouse.  Pt restless and fidgeting with monitoring equipment. Rm air Sat 86%,   94% with oxygen at 2L [place]  Dr Rubin Payor made aware.

## 2020-08-28 NOTE — ED Provider Notes (Signed)
[facility] EMERGENCY DEPARTMENT Provider Note   CSN: [id number] Arrival date & time: 08/28/20  1027     History Chief Complaint  Patient presents with   Altered Mental Status    Covid +    Destiny Lopez is a 70 y.o. female. Level 5 caveat due to dementia.  Altered Mental Status Patient brought in for mental status change.  Reportedly family called because patient became unresponsive.  Reportedly COVID-positive a week ago.  Unresponsive when found by family today.  EMS was called patient improved after oxygen was applied.  However I do not see that a pulse ox was taken before that.  Mental status improved now at her baseline which is very demented.  Reportedly baseline will be pulling at close and really nonverbal.    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   Dysphagia 02/21/2020   Thrombocytopenia (HCC) 02/21/2020   Acute blood loss as cause of postoperative anemia 02/21/2020   Intertrochanteric fracture of femur (HCC) 02/18/2020   Alzheimer disease (HCC) 01/18/2018   Anxiety and depression 11/19/2011   Multinodular goiter 11/19/2011   Osteopenia 11/19/2011   Essential hypertension    Pure hypercholesterolemia     Past Surgical History:  Procedure Laterality Date   APPENDECTOMY     CHOLECYSTECTOMY     INTRAMEDULLARY (IM) NAIL INTERTROCHANTERIC Left 02/19/2020   Procedure: Operative fixation  left hip;  Surgeon: Oliver Barre, MD;  Location: [facility];  Service: Orthopedics;  Laterality: Left;   TONSILLECTOMY       OB History   No obstetric history on file.     Family History  Problem Relation Age of Onset   Coronary artery disease Father 39       Died MI age 15   Alzheimer's disease Mother        Pacemaker   Coronary artery disease Brother 5       Heart attacks in his 86s    Social History   Tobacco Use   Smoking status: Never   Smokeless tobacco: Never  Substance Use Topics   Alcohol use: No   Drug use: No    Home Medications Prior to Admission medications   Medication Sig Start Date End Date Taking? Authorizing Provider  acetaminophen (TYLENOL) 325 MG tablet Take 650 mg by mouth every 6 (six) hours. 02/21/20   [provider]  Amino Acids-Protein Hydrolys (PRO-STAT SUGAR FREE PO) Take 30 mLs by mouth 2 (two) times daily between meals. 02/26/20   [provider]  aspirin 81 MG chewable tablet Chew 81 mg by mouth daily. 03/19/20   [provider]  Despina Hidden Oil (VENELEX) OINT Apply 1 application topically 3 (three) times daily. Special Instructions: Apply to sacrum and bilateral buttocks qshift for prevention. Every Shift 02/22/20   [provider]  calcium carbonate (OSCAL) 1500 (600 Ca) MG TABS tablet Take 1,500 mg  by mouth 2 (two) times daily with a meal. 02/20/20   [provider]  cetirizine (ZYRTEC) 10 MG tablet Take 10 mg by mouth daily.    [provider]  Ensure (ENSURE) Take 237 mLs by mouth 2 (two) times daily between meals. 02/22/20   [provider]  famotidine (PEPCID) 20 MG tablet Take 1 tablet (20 mg total) by mouth daily. 02/20/20 02/19/21  Johnson, Clanford L, MD  KP FERROUS SULFATE PO Take 5 mLs by mouth daily in the afternoon. 02/22/20   [provider]  Melatonin 5 MG CAPS Take 5 mg by mouth at bedtime.    [provider]  Memantine HCl-Donepezil HCl (NAMZARIC) 28-10  MG CP24 Take 1 capsule by mouth daily. 03/27/20   Sharee Holster, NP  mirtazapine (REMERON) 45 MG tablet Take 1 tablet (45 mg total) by mouth at bedtime. 03/27/20   Sharee Holster, NP  Multiple Vitamins-Minerals (HIGH POTENCY MULTIVIT/MIN/IRON PO) Take 1 tablet by mouth daily. 02/20/20   [provider]  NON FORMULARY Diet Change: Dys 3 (chopped) diet, continue thin liquids 02/28/20   [provider]  potassium chloride 20 MEQ/15ML (10%) SOLN Take 15 mLs (20 mEq total) by mouth daily. 03/27/20   Sharee Holster, NP  pravastatin (PRAVACHOL) 20 MG tablet Take 1 tablet (20 mg total) by mouth every evening. 03/27/20   Sharee Holster, NP    Allergies    Dexlansoprazole and Sodium pentobarbital [pentobarbital sodium]  Review of Systems   Review of Systems  Unable to perform ROS: Dementia   Physical Exam Updated Vital Signs BP 101/62 (BP Location: Right Arm)   Pulse 90   Temp 98.4 F (36.9 C) (Oral)   Resp 18   SpO2 94%   Physical Exam Vitals reviewed.  Constitutional:      Comments: Initially just sitting in bed with eyes closed.  Later would open and look but would still not follow commands.  HENT:     Head: Atraumatic.     Mouth/Throat:     Mouth: Mucous membranes are moist.  Eyes:     Extraocular Movements: Extraocular movements intact.  Cardiovascular:     Rate and Rhythm: Regular rhythm.  Pulmonary:     Breath sounds: No rhonchi or rales.  Musculoskeletal:        General: No tenderness.     Cervical back: Neck supple.  Skin:    Findings: No lesion or rash.  Neurological:     Comments: Nonverbal.  Really will not follow commands.  Patient had tremor on right side when pulse ox was on but it resolved with removal of the pulse ox.    ED Results / Procedures / Treatments   Labs (all labs ordered are listed, but only abnormal results are displayed) Labs Reviewed  LACTIC ACID, PLASMA - Abnormal; Notable for the following components:      Result Value    Lactic Acid, Venous 2.0 (*)    All other components within normal limits  CBC WITH DIFFERENTIAL/PLATELET - Abnormal; Notable for the following components:   RBC 5.28 (*)    Hemoglobin 15.4 (*)    HCT 47.3 (*)    Lymphs Abs 0.4 (*)    All other components within normal limits  COMPREHENSIVE METABOLIC PANEL - Abnormal; Notable for the following components:   Potassium 3.3 (*)    Glucose, Bld 129 (*)    BUN 41 (*)    Albumin 3.3 (*)    AST  51 (*)    Total Bilirubin 1.4 (*)    All other components within normal limits  D-DIMER, QUANTITATIVE - Abnormal; Notable for the following components:   D-Dimer, Quant 1.18 (*)    All other components within normal limits  LACTATE DEHYDROGENASE - Abnormal; Notable for the following components:   LDH 227 (*)    All other components within normal limits  FIBRINOGEN - Abnormal; Notable for the following components:   Fibrinogen >800 (*)    All other components within normal limits  CULTURE, BLOOD (ROUTINE X 2)  CULTURE, BLOOD (ROUTINE X 2)  RESP PANEL BY RT-PCR (FLU A&B, COVID) ARPGX2  PROCALCITONIN  TRIGLYCERIDES  LACTIC ACID, PLASMA  FERRITIN  C-REACTIVE PROTEIN    EKG None  Radiology DG Chest Port 1 View  Result Date: 08/28/2020 CLINICAL DATA:  Difficulty arousing patient today, confusion at baseline but less responsive this morning, shortness of breath, entire family is COVID-19 positive EXAM: PORTABLE CHEST 1 VIEW COMPARISON:  Portable exam 1125 hours compared to 02/18/2020 FINDINGS: Normal heart size, mediastinal contours, and pulmonary vascularity. Emphysematous and chronic bronchitic changes consistent with COPD. Patchy infiltrates RIGHT lung consistent with pneumonia. No pleural effusion or pneumothorax. Probable RIGHT nipple shadow. Bones demineralized. IMPRESSION: COPD changes with patchy RIGHT lung infiltrates consistent with pneumonia. Electronically Signed   By: Ulyses Southward M.D.   On: 08/28/2020 11:57    Procedures Procedures    Medications Ordered in ED Medications - No data to display  ED Course  I have reviewed the triage vital signs and the nursing notes.  Pertinent labs & imaging results that were available during my care of the patient were reviewed by me and considered in my medical decision making (see chart for details).    MDM Rules/Calculators/A&P                           Patient with COVID infection.  Hypoxic without oxygen.  Proving nasal cannula oxygen.  Unsure of vaccination status.  Patient baseline severe dementia.  Reported that everyone in family has COVID.  Requiring oxygen.  X-ray showed possible pneumonia.  Will admit to hospitalist Final Clinical Impression(s) / ED Diagnoses Final diagnoses:  COVID-19  Hypoxia    Rx / DC Orders ED Discharge Orders     None        Benjiman Core, MD 08/28/20 1245

## 2020-08-28 NOTE — ED Triage Notes (Signed)
Per family, diagnosed with covid one week ago, today family had difficulty  rousing, pt dx dementia, confused at baseline, but less responsive for family this morning.   EMS repositioned pt upright and applied oxygen with improvement in alertness.  Denies fevers.  Entire family covid +.  PMH: Dementia, anxiety, swallowing impaired.

## 2020-08-28 NOTE — H&P (Signed)
History and Physical   Patient: Destiny Lopez                            PCP: Eartha InchBadger, Michael C, MD                    DOB: 02/06/1950            DOA: 08/28/2020 [id number]             DOS: 08/28/2020, 1:25 PM  Eartha InchBadger, Michael C, MD  Patient coming from:   HOME  I have personally reviewed patient's medical records, in electronic medical records, including:  [place] link, and care everywhere.    Chief Complaint:   Chief Complaint  Patient presents with   Altered Mental Status    Covid +    History of present illness:    Destiny Lopez is a 70 y.o. female with medical history significant of advanced Alzheimer's dementia, noncommunicating, hypertension, hyperlipidemia, GERD, depression with dementia, thrombocytopenia, osteopenia... Scented from home via EMS, for hypoxia, worsening mental status recent SARS-CoV-2 positive.  Called patient's POA husband Mr. Arlean Hoppingatrick Dinunzio who confirmed the patient has advanced dementia nonverbal starting this year, her mental status progressively got worse with noted hypoxia O2 sat ranging 86 to 90% at home.  He confirms that the household husband son and the patient is vaccinated with 1 boost.     Patient Denies having: Fever, Chills,  Chest Pain, Abd pain, N/V/D, headache, dizziness, lightheadedness,  Dysuria, Joint pain, rash, open wounds  ED Course:   Vitals:   08/28/20 1200 08/28/20 1230  BP: 101/62 102/71  Pulse: 90 63  Resp: 18 17  Temp:    SpO2: 94% 99%   Abnormal labs; WBC 7.9, hemoglobin 15.4, platelets 201, potassium 3.3, BUN 41, glucose 129, lactic acid 2.0, procalcitonin 0.68, LDL 227, ferritin 423, CRP three 3.8, D-dimer 1.8, fibrinogen> 800,  SARS-CoV-2 positive,  X-ray revealing right lung infiltrate, consistent with pneumonia, COPD changes   Review of Systems: As per HPI, otherwise 10 point review of systems were negative.    ----------------------------------------------------------------------------------------------------------------------  Allergies  Allergen Reactions   Dexlansoprazole Other (See Comments)   Sodium Pentobarbital [Pentobarbital Sodium] Nausea And Vomiting    Home MEDs:  Prior to Admission medications   Medication Sig Start Date End Date Taking? Authorizing Provider  acetaminophen (TYLENOL) 325 MG tablet Take 650 mg by mouth every 6 (six) hours. 02/21/20  Yes [provider]  calcium carbonate (OSCAL) 1500 (600 Ca) MG TABS tablet Take 1,500 mg by mouth 2 (two) times daily with a meal. 02/20/20  Yes [provider]  Calcium Citrate (CITRACAL PO) Take 1 tablet by mouth daily.   Yes [provider]  cetirizine (ZYRTEC) 10 MG tablet Take 10 mg by mouth daily.   Yes [provider]  KLOR-CON M20 20 MEQ tablet Take 20 mEq by mouth daily. 07/02/20  Yes [provider]  Melatonin 5 MG CAPS Take 5 mg by mouth at bedtime.   Yes [provider]  meloxicam (MOBIC) 7.5 MG tablet Take 7.5 mg by mouth daily. 07/02/20  Yes [provider]  Memantine HCl-Donepezil HCl (NAMZARIC) 28-10 MG CP24 Take 1 capsule by mouth daily. 03/27/20  Yes Sharee HolsterGreen, Deborah S, NP  mirtazapine (REMERON) 45 MG tablet Take 1 tablet (45 mg total) by mouth at bedtime. 03/27/20  Yes Sharee HolsterGreen, Deborah S, NP  Multiple Vitamins-Minerals (HIGH POTENCY MULTIVIT/MIN/IRON PO)  Take 1 tablet by mouth daily. 02/20/20  Yes [provider]  Amino Acids-Protein Hydrolys (PRO-STAT SUGAR FREE PO) Take 30 mLs by mouth 2 (two) times daily between meals. 02/26/20   [provider]  Despina Hidden Oil (VENELEX) OINT Apply 1 application topically 3 (three) times daily. Special Instructions: Apply to sacrum and bilateral buttocks qshift for prevention. Every Shift 02/22/20   [provider]  famotidine (PEPCID) 20 MG tablet Take 1 tablet (20 mg total) by mouth daily. Patient  not taking: Reported on 08/28/2020 02/20/20 02/19/21  Cleora Fleet, MD  NON FORMULARY Diet Change: Dys 3 (chopped) diet, continue thin liquids 02/28/20   [provider]  potassium chloride 20 MEQ/15ML (10%) SOLN Take 15 mLs (20 mEq total) by mouth daily. Patient not taking: No sig reported 03/27/20   Sharee Holster, NP  pravastatin (PRAVACHOL) 20 MG tablet Take 1 tablet (20 mg total) by mouth every evening. Patient not taking: No sig reported 03/27/20   Sharee Holster, NP    PRN MEDs: acetaminophen, ondansetron **OR** ondansetron (ZOFRAN) IV, oxyCODONE, polyethylene glycol, traZODone  Past Medical History:  Diagnosis Date   Alzheimer disease (HCC) 01/18/2018   Chest pain, unspecified    Dysthymic disorder    Irritable bowel syndrome    Memory disorder 04/10/2013   Memory loss    Other malaise and fatigue    Pure hypercholesterolemia    Shortness of breath    Unspecified essential hypertension     Past Surgical History:  Procedure Laterality Date   APPENDECTOMY     CHOLECYSTECTOMY     INTRAMEDULLARY (IM) NAIL INTERTROCHANTERIC Left 02/19/2020   Procedure: Operative fixation left hip;  Surgeon: Oliver Barre, MD;  Location: [facility];  Service: Orthopedics;  Laterality: Left;   TONSILLECTOMY       reports that she has never smoked. She has never used smokeless tobacco. She reports that she does not drink alcohol and does not use drugs.   Family History  Problem Relation Age of Onset   Coronary artery disease Father 62       Died MI age 7   Alzheimer's disease Mother        Pacemaker   Coronary artery disease Brother 89       Heart attacks in his 11s    Physical Exam:   Vitals:   08/28/20 1100 08/28/20 1130 08/28/20 1200 08/28/20 1230  BP: (!) 118/57 (!) 124/53 101/62 102/71  Pulse: 99 84 90 63  Resp: (!) 28 15 18 17   Temp:      TempSrc:      SpO2: (!) 84% 95% 94% 99%   Constitutional: Severe cachexia, generalized muscle wasting NAD, calm, comfortable,  pleasantly confused-nonverbal Eyes: PERRL, lids and conjunctivae normal ENMT: Mucous membranes are moist. Posterior pharynx clear of any exudate or lesions.Normal dentition.  Neck: normal, supple, no masses, no thyromegaly Respiratory: clear to auscultation bilaterally, no wheezing, no crackles. Normal respiratory effort. No accessory muscle use.  Cardiovascular: Regular rate and rhythm, no murmurs / rubs / gallops. No extremity edema. 2+ pedal pulses. No carotid bruits.  Abdomen: no tenderness, no masses palpated. No hepatosplenomegaly. Bowel sounds positive.  Musculoskeletal: Global generalized weaknesses, no clubbing / cyanosis. No joint deformity upper and lower extremities. Good ROM, no contractures. Normal muscle tone.  Neurologic: CN II-XII grossly intact. Sensation intact, DTR normal. Strength 5/5 in all 4.  Psychiatric: Normal judgment and insight. Alert and oriented x 3. Normal mood.  Skin: no  rashes, lesions, ulcers. No induration Decubitus/ulcers:  Wounds: per nursing documentation         Labs on admission:    I have personally reviewed following labs and imaging studies  CBC: Recent Labs  Lab 08/28/20 1141  WBC 7.9  NEUTROABS 7.1  HGB 15.4*  HCT 47.3*  MCV 89.6  PLT 201   Basic Metabolic Panel: Recent Labs  Lab 08/28/20 1141  NA 144  K 3.3*  CL 107  CO2 25  GLUCOSE 129*  BUN 41*  CREATININE 0.87  CALCIUM 8.9   GFR: CrCl cannot be calculated (Unknown ideal weight.). Liver Function Tests: Lipid Profile: Recent Labs    08/28/20 1142  TRIG 81   Thyroid Function Tests: No results for input(s): TSH, T4TOTAL, FREET4, T3FREE, THYROIDAB in the last 72 hours. Anemia Panel: Recent Labs    08/28/20 1142  FERRITIN 423*   Urine analysis:    Component Value Date/Time   COLORURINE YELLOW 02/19/2020 0142   APPEARANCEUR CLOUDY (A) 02/19/2020 0142   LABSPEC 1.028 02/19/2020 0142   PHURINE 5.0 02/19/2020 0142   GLUCOSEU NEGATIVE 02/19/2020 0142    HGBUR MODERATE (A) 02/19/2020 0142   BILIRUBINUR NEGATIVE 02/19/2020 0142   KETONESUR 5 (A) 02/19/2020 0142   PROTEINUR 30 (A) 02/19/2020 0142   NITRITE POSITIVE (A) 02/19/2020 0142   LEUKOCYTESUR NEGATIVE 02/19/2020 0142     Radiologic Exams on Admission:   DG Chest Port 1 View  Result Date: 08/28/2020 CLINICAL DATA:  Difficulty arousing patient today, confusion at baseline but less responsive this morning, shortness of breath, entire family is COVID-19 positive EXAM: PORTABLE CHEST 1 VIEW COMPARISON:  Portable exam 1125 hours compared to 02/18/2020 FINDINGS: Normal heart size, mediastinal contours, and pulmonary vascularity. Emphysematous and chronic bronchitic changes consistent with COPD. Patchy infiltrates RIGHT lung consistent with pneumonia. No pleural effusion or pneumothorax. Probable RIGHT nipple shadow. Bones demineralized. IMPRESSION: COPD changes with patchy RIGHT lung infiltrates consistent with pneumonia. Electronically Signed   By: Ulyses Southward M.D.   On: 08/28/2020 11:57    EKG:   Independently reviewed.  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)   Thrombocytopenia (HCC)   SARS-CoV-2 virus infection with hypoxia/pneumonia -Patient was satting 84 upon arrival on room air, improved to 99% on 2 L -According to the husband at home satting 86 to 90%, not O2 dependent -WBC 7.9, hemoglobin 15.4, platelets 201, potassium 3.3, BUN 41, glucose 129, lactic acid 2.0, procalcitonin 0.68, LDL 227,  ferritin 423, CRP three 3.8, D-dimer 1.8, fibrinogen> 800, - SARS-CoV-2 positive, X-ray revealing right lung infiltrate, consistent with pneumonia, COPD ch -Initiating remdesivir,  -IV steroids with quick taper -Empiric antibiotics Rocephin, azithromycin.. -Continue supplemental oxygen, maintaining O2 sat greater 90%, inhaled bronchodilators   Active Problems:    Essential hypertension -blood pressure remained soft, with holding home BP meds    Pure hypercholesterolemia -resume statins    Anxiety and depression -as needed Xanax    Alzheimer disease (HCC)  -not on any medication-Per husband progressively worsening over past year, nonverbal at baseline    GERD without esophagitis -continue  PPI    Depression due to dementia [facility]) -continue home medication of Remeron    Thrombocytopenia (HCC) -monitoring stable  To be cachexia, malnutrition There is no height or weight on file to calculate BMI. Consulting dietitian for nutrition supplements   Ethics: Discussed with her husband, current findings, plan of care and admission.  Also discussed CODE STATUS confirmed DNR/DNI status.  Also confirmed nonverbal at baseline, continue to decline. He expressed understanding and agreed to current plan.  Cultures:  -Blood and sputum Cultures >> Antimicrobial: -Romycin, Rocephin  Consults called:  None -------------------------------------------------------------------------------------------------------------------------------------------- DVT prophylaxis: SCD/Compression stockings and Heparin SQ Code Status:   Code Status: Full Code -confirmed by her husband   Admission status: Patient will be admitted as Inpatient, with a greater than 2 midnight length of stay. Level of care: Med-Surg   Family Communication:  Spuse Mr. Destiny Lopez  (The above findings and plan of care has been discussed with patient in detail, the patient expressed understanding and agreement of above plan)   --------------------------------------------------------------------------------------------------------------------------------------------------  Disposition Plan: >3 days Status is: Inpatient  Remains inpatient appropriate because:Hemodynamically unstable, IV treatments appropriate due to intensity of illness or inability to take PO, and Inpatient level of care appropriate due to severity of illness  Dispo: The patient is from: Home              Anticipated d/c is to: Home              Patient currently is not medically stable to d/c.   Difficult to place patient No       ----------------------------------------------------------------------------------------------------------------------------------------------------  Time spent: > than  55  Min.   SIGNED: Kendell Bane, MD, FHM. [facility],  Pager (Please use [email or web address] to page to text)  If 7PM-7AM, please contact night-coverage [email or web address],  08/28/2020, 1:25 PM

## 2020-08-28 NOTE — Progress Notes (Signed)
Patient is not alert enough to follow commands. Unable to give evening medications due to this. On call MD notified

## 2020-08-28 NOTE — Consult Note (Signed)
Palliative Care Consult Note                                  Date: 08/28/2020   Patient Name: Destiny Lopez  DOB: 05/21/1950  MRN: [id number]  Age / Sex: 70 y.o., female  PCP: Eartha InchBadger, Michael C, MD Referring Physician: Kendell BaneShahmehdi, Seyed A, MD  Reason for Consultation: Establishing goals of care and Non pain symptom management  HPI/Patient Profile: 70 y.o. female  with past medical history of advanced Alzheimer's dementia, noncommunicating, hypertension, hyperlipidemia, GERD, depression with dementia, thrombocytopenia, osteopenia admitted on 08/28/2020 with COVID-19+, pneumonia, hypoxia.  Per the patient's husband her home O2 sat ranges 86 to 90%.  The patient and her husband were both positive for COVID-19, as well as their son who tested positive several days before them.  Her husband states that both he and she slept for most 48 hours.  He woke up yesterday, and the patient was not very arousable.  At that point he called EMS because her O2 sat was 84%.  She improved 94% on nasal cannula oxygen.  X-ray completed in the ED found right lung infiltrate consistent with pneumonia as well as COPD changes.  The patient is end-stage Alzheimer's and 2 or 3 months ago she worsened from answering yes/no at times to being essentially nonverbal.  She is not aware of her current situation, environment.  She is being admitted for treatment of COVID-pneumonia with plans to discharge back home.  Past Medical History:  Diagnosis Date   Alzheimer disease (HCC) 01/18/2018   Chest pain, unspecified    Dysthymic disorder    Irritable bowel syndrome    Memory disorder 04/10/2013   Memory loss    Other malaise and fatigue    Pure hypercholesterolemia    Shortness of breath    Unspecified essential hypertension     Social History   Socioeconomic History   Marital status: Married    Spouse name: [name]   Number of children: 1   Years of education: college    Highest education level: Not on file  Occupational History   Occupation: RETIRED  Tobacco Use   Smoking status: Never   Smokeless tobacco: Never  Substance and Sexual Activity   Alcohol use: No   Drug use: No   Sexual activity: Not on file  Other Topics Concern   Not on file  Social History Narrative   Patient lives at home with her husband Destiny Lopez([name])   Education college.   Right handed.   Caffeine un sweet tea.   Social Determinants of Health   Financial Resource Strain: Not on file  Food Insecurity: Not on file  Transportation Needs: Not on file  Physical Activity: Not on file  Stress: Not on file  Social Connections: Not on file    Family History  Problem Relation Age of Onset   Coronary artery disease Father [id number]       Died MI age 72   Alzheimer's disease Mother        Pacemaker   Coronary artery disease Brother 40       Heart attacks in his 2040s    Subjective:   This NP Wynne DustEric Montoya Watkin reviewed medical records, received report from team, assessed the patient and then meet at the patient's bedside  to discuss diagnosis, prognosis, GOC, EOL wishes disposition and options.   Concept of Palliative Care was introduced as specialized medical  care for people and their families living with serious illness.  If focuses on providing relief from the symptoms and stress of a serious illness.  The goal is to improve quality of life for both the patient and the family. Values and goals of care important to patient and family were attempted to be elicited.  Created space and opportunity for patient  and family to explore thoughts and feelings regarding current medical situation   Natural trajectory and expectations at EOL were discussed. Questions and concerns addressed. Patient  encouraged to call with questions or concerns.    I saw the patient in person, who is noncommunicative.  I did speak to her husband Destiny Lopez over the phone.  He is unable to come to the hospital because he is  also COVID-19 positive.  Patient/Family Understanding of Illness: Her husband understands that she was diagnosed with Alzheimer's in 2004 and states it has been "a long slow decline."  He knows that she is in the final stages now, currently noncommunicative.  She is able to ambulate somewhat but requires his help with ADLs including ambulating, dressing, bathing.  She is able to swallow somewhat but does not take pills well so he crushes them and puts them in either yogurt or applesauce.  He was told that she will likely be admitted for about 5 days, although she could be discharged sooner depending on how she responds to treatment.  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  Since retirement she has shown interest in jams and jewelry and she and her husband have designed necklaces and other jewelry.  She has a passion for teaching, enjoys reading (especially Mistry's) and music.  He states that they are spiritual but "not a big fan to organize religion" due to previous poor experiences.  Patient Values: Things that are close to the patient and high values include family, teaching, music.  Today's Discussion: Today we discussed the insidious and irreversible nature of Alzheimer's disease.  He understands that she will not get any better and will likely just continue to progress until the end of her life.  He knows that Alzheimer's will likely take her life.  However, with her current COVID-19 pneumonia diagnosis they are wanting treatment of COVID-19 so she does not go into respiratory distress and discomfort.  We agree to treat the treatable.  He notes that she has a DNR and I confirmed  paperwork are in the system.  He is also the power of attorney and again advanced directives are in the system.  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)  Thrombocytopenia (HCC)   Scheduled Meds:  vitamin C  500 mg Oral Daily   baricitinib  2 mg Oral Daily   chlorpheniramine-HYDROcodone  5 mL Oral Q12H   donepezil  10 mg Oral QHS   guaiFENesin-dextromethorphan  10 mL Oral Q8H   heparin  5,000 Units Subcutaneous Q8H   Ipratropium-Albuterol  1 puff  Inhalation Q6H   loratadine  10 mg Oral Daily   meloxicam  7.5 mg Oral Daily   memantine  28 mg Oral Daily   methylPREDNISolone (SOLU-MEDROL) injection  1 mg/kg Intravenous Q12H   Followed by   Melene Muller ON 09/01/2020] predniSONE  50 mg Oral Q breakfast   mirtazapine  45 mg Oral QHS   zinc sulfate  220 mg Oral Daily    Continuous Infusions:  [START ON 08/29/2020] azithromycin     [START ON 08/29/2020] cefTRIAXone (ROCEPHIN)  IV     dextrose 5 % and 0.2 % NaCl     [START ON 08/29/2020] remdesivir 100 mg in NS 100 mL      PRN Meds: acetaminophen, ondansetron **OR** ondansetron (ZOFRAN) IV, oxyCODONE, polyethylene glycol, traZODone  Allergies  Allergen Reactions   Dexlansoprazole Other (See Comments)   Sodium Pentobarbital [Pentobarbital Sodium] Nausea And Vomiting    Physical Exam Vitals and nursing note reviewed.  Constitutional:      General: She is sleeping. She is not in acute distress.    Appearance: She is underweight.  HENT:     Head: Normocephalic and atraumatic.  Cardiovascular:     Rate and Rhythm: Normal rate and regular rhythm.  Pulmonary:     Effort: Pulmonary effort is normal.  Abdominal:     General: Abdomen is flat.     Palpations: Abdomen is soft.   Skin:    General: Skin is warm and dry.    Vital Signs:  BP 104/88   Pulse 96   Temp 98.4 F (36.9 C) (Oral)   Resp (!) 24   Wt 52.2 kg   SpO2 94%   BMI 17.49 kg/m  Pain Scale: PAINAD   Pain Score: 0-No pain  SpO2: SpO2: 94 % O2 Device:SpO2: 94 % O2 Flow Rate: .O2 Flow Rate (L/min): 2 L/min  IO: Intake/output summary: No intake or output data in the 24 hours ending 08/28/20 1658  LBM:   Baseline Weight: Weight: 52.2 kg Most recent weight: Weight: 52.2 kg      Palliative Assessment/Data: 40 at baseline; currently 20%   Advanced Care Planning:   Primary Decision Maker: NEXT OF KIN  Code Status/Advance Care Planning: DNR  A discussion was had today regarding advanced directives. Concepts specific to code status, artifical feeding and hydration, continued IV antibiotics and rehospitalization was had.  The difference between a aggressive medical intervention path and a palliative comfort care path for this patient at this time was had.   Decisions/Changes to ACP: Remain DNR  Assessment & Plan:   I have reviewed the medical record, interviewed the patient and family, and examined the patient. The following aspects are pertinent.  Impression: Advanced Alzheimer's COVID-19 pneumonia  Confused 70 year old female with advanced Alzheimer's now with COVID-19 pneumonia and hypoxia.  She is sleepy but arousable.  She does not appear to be in any significant distress.  Does not appear to be in pain.  No moaning, groaning, or grimacing noted on her exam today.  Her vitals have been stable.  Plans for admission for treatment of COVID-19 pneumonia and eventual discharge to home.  SUMMARY OF RECOMMENDATIONS   Remain DNR Treat the treatable (especially COVID-19 pneumonia) Safety precautions related to confusion Delirium precautions Further decisions/goals of care discussion pending clinical progress Would likely benefit from either home health hospice or home health  palliative care consult at discharge PMT will continue to follow and support holistically  Symptom Management:  Per primary team Palliative care is  available to assist with symptom management as needed No identifiable needs at this time as patient is not confused, agitated, dyspneic, and apparent pain  Palliative Prophylaxis:  Delirium Protocol and Frequent Pain Assessment  Additional Recommendations (Limitations, Scope, Preferences): Full Scope Treatment  Psycho-social/Spiritual:  Desire for further Chaplaincy support: no Additional Recommendations: Caregiving  Support/Resources, Education on Hospice, and Grief/Bereavement Support  Prognosis:  < 6 months  Discharge Planning:  To Be Determined   Discussed with:  Patient's husband/HCPOA, nursing staff, Dr. Flossie Dibble   Thank you for allowing [place] to participate in the care of Destiny Lopez PMT will continue to support holistically.  Time In: 4:00 Time Out: 5:15 Time Total: 75 min  Greater than 50%  of this time was spent counseling and coordinating care related to the above assessment and plan.  Signed by: Wynne Dust, NP Palliative Medicine Team  Team Phone # [phone] (Nights/Weekends)  08/28/2020, 4:58 PM

## 2020-08-28 NOTE — ED Notes (Signed)
Room air sat 86%, Replaced oxygen at 2L, Sat 94%

## 2020-08-28 NOTE — Progress Notes (Signed)
IS and flutter in room pt unable to follow commands at this time

## 2020-08-28 NOTE — ED Notes (Signed)
Husband [name] notified of inpatient bed assignment.

## 2020-08-29 LAB — COMPREHENSIVE METABOLIC PANEL
ALT: 37 U/L (ref 0–44)
AST: 44 U/L — ABNORMAL HIGH (ref 15–41)
Albumin: 3.2 g/dL — ABNORMAL LOW (ref 3.5–5.0)
Alkaline Phosphatase: 113 U/L (ref 38–126)
Anion gap: 11 (ref 5–15)
BUN: 50 mg/dL — ABNORMAL HIGH (ref 8–23)
CO2: 23 mmol/L (ref 22–32)
Calcium: 8.9 mg/dL (ref 8.9–10.3)
Chloride: 112 mmol/L — ABNORMAL HIGH (ref 98–111)
Creatinine, Ser: 0.78 mg/dL (ref 0.44–1.00)
GFR, Estimated: >60 mL/min (ref >60)
Glucose, Bld: 177 mg/dL — ABNORMAL HIGH (ref 70–99)
Potassium: 3.8 mmol/L (ref 3.5–5.1)
Sodium: 146 mmol/L — ABNORMAL HIGH (ref 135–145)
Total Bilirubin: 0.9 mg/dL (ref 0.3–1.2)
Total Protein: 6.8 g/dL (ref 6.5–8.1)

## 2020-08-29 LAB — CBC WITH DIFFERENTIAL/PLATELET
Abs Immature Granulocytes: 0.06 10*3/uL (ref 0.00–0.07)
Basophils Absolute: 0 10*3/uL (ref 0.0–0.1)
Basophils Relative: 0 %
Eosinophils Absolute: 0 10*3/uL (ref 0.0–0.5)
Eosinophils Relative: 0 %
HCT: 45.3 % (ref 36.0–46.0)
Hemoglobin: 14.6 g/dL (ref 12.0–15.0)
Immature Granulocytes: 1 %
Lymphocytes Relative: 4 %
Lymphs Abs: 0.4 10*3/uL — ABNORMAL LOW (ref 0.7–4.0)
MCH: 28.8 pg (ref 26.0–34.0)
MCHC: 32.2 g/dL (ref 30.0–36.0)
MCV: 89.3 fL (ref 80.0–100.0)
Monocytes Absolute: 0.3 10*3/uL (ref 0.1–1.0)
Monocytes Relative: 4 %
Neutro Abs: 7.7 10*3/uL (ref 1.7–7.7)
Neutrophils Relative %: 91 %
Platelets: 226 10*3/uL (ref 150–400)
RBC: 5.07 MIL/uL (ref 3.87–5.11)
RDW: 14.7 % (ref 11.5–15.5)
WBC: 8.5 10*3/uL (ref 4.0–10.5)
nRBC: 0 % (ref 0.0–0.2)

## 2020-08-29 LAB — D-DIMER, QUANTITATIVE: D-Dimer, Quant: 1.17 ug/mL-FEU — ABNORMAL HIGH (ref 0.00–0.50)

## 2020-08-29 LAB — C-REACTIVE PROTEIN: CRP: 28.4 mg/dL — ABNORMAL HIGH (ref <1.0)

## 2020-08-29 LAB — FERRITIN: Ferritin: 471 ng/mL — ABNORMAL HIGH (ref 11–307)

## 2020-08-29 MED ORDER — DEXTROSE 5 % IV SOLN: INTRAVENOUS | Status: DC

## 2020-08-29 MED ORDER — ALPRAZOLAM 0.25 MG PO TABS
0.2500 mg | ORAL_TABLET | Freq: Three times a day (TID) | ORAL | Status: DC | PRN
  Administered 2020-08-30: 0.25 mg via ORAL
  Filled 2020-08-29: qty 1

## 2020-08-29 MED ORDER — HALOPERIDOL LACTATE 5 MG/ML IJ SOLN
2.0000 mg | Freq: Four times a day (QID) | INTRAMUSCULAR | Status: DC | PRN
  Administered 2020-08-29 – 2020-09-01 (×4): 2 mg via INTRAVENOUS
  Filled 2020-08-29 (×4): qty 1

## 2020-08-29 MED ORDER — IPRATROPIUM-ALBUTEROL 20-100 MCG/ACT IN AERS: 1.0000 | INHALATION_SPRAY | Freq: Four times a day (QID) | RESPIRATORY_TRACT | Status: DC | PRN

## 2020-08-29 MED ORDER — ORAL CARE MOUTH RINSE
15.0000 mL | Freq: Two times a day (BID) | OROMUCOSAL | Status: DC
  Administered 2020-08-29 – 2020-09-03 (×12): 15 mL via OROMUCOSAL

## 2020-08-29 MED ORDER — ENSURE ENLIVE PO LIQD
237.0000 mL | Freq: Three times a day (TID) | ORAL | Status: DC
  Administered 2020-08-30 – 2020-09-03 (×8): 237 mL via ORAL

## 2020-08-29 NOTE — Progress Notes (Signed)
Patient mucosa dry at this time. Patient unable to follow commands for medication administration. Will offer oral care. Dr. Flossie Dibble notified and SLP consult ordered for patient . She is alert , however she is unable to follow commands for oral intake at this time.

## 2020-08-29 NOTE — Progress Notes (Signed)
Small amount of applesauce placed in patients mouth she held it in there and was unable to swallow , removed by oral sponge . Unable to tolerate PO at this time.

## 2020-08-29 NOTE — Evaluation (Signed)
Clinical/Bedside Swallow Evaluation Patient Details  Name: Destiny Lopez MRN: [id number] Date of Birth: 1950/09/21  Today's Date: 08/29/2020 Time: SLP Start Time (ACUTE ONLY): 1600 SLP Stop Time (ACUTE ONLY): 1624 SLP Time Calculation (min) (ACUTE ONLY): 24 min  Past Medical History:  Past Medical History:  Diagnosis Date   Alzheimer disease (HCC) 01/18/2018   Chest pain, unspecified    Dysthymic disorder    Irritable bowel syndrome    Memory disorder 04/10/2013   Memory loss    Other malaise and fatigue    Pure hypercholesterolemia    Shortness of breath    Unspecified essential hypertension    Past Surgical History:  Past Surgical History:  Procedure Laterality Date   APPENDECTOMY     CHOLECYSTECTOMY     INTRAMEDULLARY (IM) NAIL INTERTROCHANTERIC Left 02/19/2020   Procedure: Operative fixation left hip;  Surgeon: Oliver Barre, MD;  Location: [facility];  Service: Orthopedics;  Laterality: Left;   TONSILLECTOMY     HPI:  70 y.o. female  with past medical history of advanced Alzheimer's dementia, noncommunicating, hypertension, hyperlipidemia, GERD, depression with dementia, thrombocytopenia, osteopenia admitted on 08/28/2020 with COVID-19+, pneumonia, hypoxia.  Per the patient's husband her home O2 sat ranges 86 to 90%.  The patient and her husband were both positive for COVID-19, as well as their son who tested positive several days before them.  Her husband states that both he and she slept for most 48 hours.  He woke up yesterday, and the patient was not very arousable.  At that point he called EMS because her O2 sat was 84%.  She improved 94% on nasal cannula oxygen.  X-ray completed in the ED found right lung infiltrate consistent with pneumonia as well as COPD changes. The patient is end-stage Alzheimer's and 2 or 3 months ago she worsened from answering yes/no at times to being essentially nonverbal.  She is not aware of her current situation, environment.  She is being admitted for  treatment of COVID-pneumonia with plans to discharge back home. BSE requested.   Assessment / Plan / Recommendation Clinical Impression  Pt presents with cognitive based dysphagia in setting of advanced dementia and acute illness (COVID+). Pt occasionally verbal but non-sensical. Oral care recently completed by staff (lips and tongue dry and cracked). Pt with reduced labial seal with presentations and oral holding initially with one immediate cough and expulsion of remaining liquids from oral cavity. Continued coaxing and additional trials were more effective and better tolerated. Pt eventually consumed 240 cc water via straw sips, 60 cc NTL via straw, and a few bites of applesauce and graham crackers. Pt with munching mastication pattern with prolonged oral transit and min lingual residue with solids. While Pt is at risk for aspiration due to impaired cognition, with careful feeding, risks can be minimized. She is likely at greater risk for dehydration and inability to meet caloric needs. Recommend to downgrade textures to D2 and thin liquids (may need to downgrade further to D1/puree if staff has trouble feeding her) with 1:1 feeder assistance and encouragement for intake as long as Pt is alert and attentive to feeding. She used a straw fairly well, but may need to provide spoon or straw tip to lips, or ice chip to lips first to engage her in task. Provide medications crushed as able in puree. SLP will follow during acute stay. Above to RN. SLP Visit Diagnosis: Dysphagia, unspecified (R13.10)    Aspiration Risk  Moderate aspiration risk;Risk for inadequate nutrition/hydration  Diet Recommendation Dysphagia 2 (Fine chop);Thin liquid   Liquid Administration via: Cup;Straw Medication Administration: Crushed with puree Supervision: Staff to assist with self feeding;Full supervision/cueing for compensatory strategies Compensations: Slow rate;Small sips/bites;Lingual sweep for clearance of  pocketing Postural Changes: Seated upright at 90 degrees;Remain upright for at least 30 minutes after po intake    Other  Recommendations Oral Care Recommendations: Oral care before and after PO;Staff/trained caregiver to provide oral care Other Recommendations: Clarify dietary restrictions   Follow up Recommendations 24 hour supervision/assistance      Frequency and Duration min 2x/week  1 week       Prognosis Prognosis for Safe Diet Advancement: Fair Barriers to Reach Goals: Cognitive deficits      Swallow Study   General Date of Onset: 08/28/20 HPI: 70 y.o. female  with past medical history of advanced Alzheimer's dementia, noncommunicating, hypertension, hyperlipidemia, GERD, depression with dementia, thrombocytopenia, osteopenia admitted on 08/28/2020 with COVID-19+, pneumonia, hypoxia.  Per the patient's husband her home O2 sat ranges 86 to 90%.  The patient and her husband were both positive for COVID-19, as well as their son who tested positive several days before them.  Her husband states that both he and she slept for most 48 hours.  He woke up yesterday, and the patient was not very arousable.  At that point he called EMS because her O2 sat was 84%.  She improved 94% on nasal cannula oxygen.  X-ray completed in the ED found right lung infiltrate consistent with pneumonia as well as COPD changes. The patient is end-stage Alzheimer's and 2 or 3 months ago she worsened from answering yes/no at times to being essentially nonverbal.  She is not aware of her current situation, environment.  She is being admitted for treatment of COVID-pneumonia with plans to discharge back home. BSE requested. Type of Study: Bedside Swallow Evaluation Previous Swallow Assessment: N/A Diet Prior to this Study: Regular;Thin liquids Temperature Spikes Noted: No Respiratory Status: Nasal cannula History of Recent Intubation: No Behavior/Cognition: Alert;Requires cueing Oral Cavity Assessment: Dry Oral  Care Completed by SLP: Recent completion by staff Oral Cavity - Dentition: Adequate natural dentition Vision: Impaired for self-feeding Self-Feeding Abilities: Needs assist Patient Positioning: Upright in bed Baseline Vocal Quality: Normal Volitional Cough: Cognitively unable to elicit Volitional Swallow: Unable to elicit    Oral/Motor/Sensory Function Overall Oral Motor/Sensory Function:  (Appears grossly intact, Pt unable to follow commands)   Ice Chips Ice chips: Impaired Presentation: Spoon Oral Phase Impairments: Reduced labial seal Oral Phase Functional Implications: Prolonged oral transit   Thin Liquid Thin Liquid: Impaired Presentation: Cup;Straw Oral Phase Impairments: Reduced lingual movement/coordination Oral Phase Functional Implications: Prolonged oral transit;Oral holding Pharyngeal  Phase Impairments: Multiple swallows;Cough - Immediate (immediate cough x1 and expulsion of liquids from oral cavity, improved with additional trials)    Nectar Thick Nectar Thick Liquid: Within functional limits Presentation: Straw   Honey Thick Honey Thick Liquid: Not tested   Puree Puree: Within functional limits Presentation: Spoon   Solid     Solid: Impaired Presentation: Spoon Oral Phase Impairments: Reduced lingual movement/coordination;Impaired mastication Oral Phase Functional Implications: Prolonged oral transit;Impaired mastication;Oral residue     Thank you,  Havery Moros, CCC-SLP [phone]  Arionna Hoggard 08/29/2020,5:08 PM

## 2020-08-29 NOTE — Plan of Care (Signed)

## 2020-08-29 NOTE — Progress Notes (Signed)
Patient agitated, pulling at objects, keeps biting on her o2 cannula . Dr. Flossie Dibble is aware. Redirection helpful for short period. Patient repositioned frequently as she is restless and will lay across the bed instead of straight in the bed. Will continue to monitor

## 2020-08-29 NOTE — Progress Notes (Addendum)
Initial Nutrition Assessment  DOCUMENTATION CODES:   Severe malnutrition in context of chronic illness  INTERVENTION:  Recommend liberalize diet to regular  ST evaluation pending per nursing  Ensure Enlive po TID, each supplement provides 350 kcal and 20 grams of protein    NUTRITION DIAGNOSIS:   Severe Malnutrition related to chronic illness (dementia) as evidenced by severe fat depletion, severe muscle depletion, energy intake < or equal to 75% for > or equal to 1 month.  GOAL:   (based on Palliative discussion regarding GOC)   MONITOR:  PO intake, Supplement acceptance, Skin, Labs, Weight trends  REASON FOR ASSESSMENT:   Consult Assessment of nutrition requirement/status  ASSESSMENT: Patient is an underweight 70 yo female with hx of dementia. She presents with altered mental status. Confused at baseline per MD COVID positive ~ 1 week ago and found unresponsive by family.   Patient has diet order but not alert enough to eat per nursing. ST evaluation pending. RD will follow for results. Recommend liberalize therapeutic restriction given her poor intake and underweight state.   Patient laying with feet off bed and head against rail. Unable to answer questions or did not interact with RD during visit. Baseline unknown. Minimal fat and muscle reserves.   Review of weights: loss of (8.2 kg) 16% 5 months which is severe.  Medications reviewed and include: Vit C, Aricept, Namenda, Remeron, Zinc.   Labs: BMP Latest Ref Rng & Units 08/29/2020 08/28/2020 02/26/2020  Glucose 70 - 99 mg/dL 960(A) 540(J) 811(B)  BUN 8 - 23 mg/dL 14(N) 82(N) 20  Creatinine 0.44 - 1.00 mg/dL 5.62 1.30 8.65  Sodium 135 - 145 mmol/L 146(H) 144 140  Potassium 3.5 - 5.1 mmol/L 3.8 3.3(L) 4.2  Chloride 98 - 111 mmol/L 112(H) 107 106  CO2 22 - 32 mmol/L 23 25 27   Calcium 8.9 - 10.3 mg/dL 8.9 8.9 9.1     NUTRITION - FOCUSED PHYSICAL EXAM:  Flowsheet Row Most Recent Value  Orbital Region Moderate  depletion  Upper Arm Region Severe depletion  Thoracic and Lumbar Region Severe depletion  Buccal Region Mild depletion  Temple Region No depletion  Clavicle Bone Region Severe depletion  Clavicle and Acromion Bone Region Severe depletion  Scapular Bone Region Severe depletion  Dorsal Hand Severe depletion  Patellar Region Moderate depletion  Anterior Thigh Region Severe depletion  Posterior Calf Region Moderate depletion  Edema (RD Assessment) None  Hair Reviewed  Eyes Reviewed  Mouth Reviewed  Skin Reviewed  Nails Reviewed      Diet Order:   Diet Order             Diet Heart Room service appropriate? Yes; Fluid consistency: Thin  Diet effective now                   EDUCATION NEEDS:  Not appropriate for education at this time  Skin:  Skin Assessment: Reviewed RN Assessment  Last BM:  8/18 type 4   Height:   Ht Readings from Last 1 Encounters:  03/27/20 5\' 8"  (1.727 m)    Weight:   Wt Readings from Last 1 Encounters:  08/28/20 44.1 kg    Ideal Body Weight:   64 kg  BMI:  Body mass index is 14.78 kg/m.  Estimated Nutritional Needs:   Kcal:  1320-1540  Protein:  66-79 gr  Fluid:  >1300 ml daily  MS,RD,CSG,LDN Contact: 08/30/20

## 2020-08-29 NOTE — Progress Notes (Signed)
Progress note  Patient: Destiny Lopez                            PCP: Eartha InchBadger, Michael C, MD                    DOB: 07/13/1950            DOA: 08/28/2020 [id number]             DOS: 08/29/2020, 1:00 PM  Eartha InchBadger, Michael C, MD  Patient coming from:   HOME  I have personally reviewed patient's medical records, in electronic medical records, including:  [place] link, and care everywhere.    CC: Altered mental status, shortness of breath, COVID-positive  Subjective   The patient was seen and examined this morning, easily arousable awake, nonverbal, pleasantly confused with advanced dementia In no respiratory distress Satting 97% on 2 L  Per nursing staff as she is not following command, may have swallowing difficulty, speech has been consulted for evaluation   Hospital course:     Destiny Lopez is a 70 y.o. female with medical history significant of advanced Alzheimer's dementia, noncommunicating, hypertension, hyperlipidemia, GERD, depression with dementia, thrombocytopenia, osteopenia... Scented from home via EMS, for hypoxia, worsening mental status recent SARS-CoV-2 positive.  Called patient's POA husband Mr. Arlean Hoppingatrick Cottone who confirmed the patient has advanced dementia nonverbal starting this year, her mental status progressively got worse with noted hypoxia O2 sat ranging 86 to 90% at home.  He confirms that the household ,husband, son and the patient is vaccinated with 1 boost.   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)   Thrombocytopenia (HCC)   SARS-CoV-2 virus infection with hypoxia/pneumonia -Remained stable my respiratory distress, remains on 2 L of oxygen, satting 97%  -According to the husband at home satting 86 to 90%, not O2  dependent -WBC 7.9, hemoglobin 15.4, platelets 201, potassium 3.3, BUN 41, glucose 129, lactic acid 2.0, procalcitonin 0.68, LDL 227, ferritin 423, CRP three 3.8, D-dimer 1.8, fibrinogen> 800, - SARS-CoV-2 positive, X-ray revealing right lung infiltrate, consistent with pneumonia, COPD ch -Initiating remdesivir,  -IV steroids with quick taper -Empiric antibiotics Rocephin, azithromycin.. -We will continue supplemental oxygen with goal to taper off to room air  Dysphagia -Exacerbated by poor cognition, advancing dementia -We will keep the patient n.p.o. -Continue gentle IV fluid hydration -Consulting speech and dietitian for evaluation recommendation   Active Problems:    Essential hypertension -BP stable without medication    Pure hypercholesterolemia -resume statins    Anxiety and depression -as needed Xanax, stable    Alzheimer disease (HCC)  -Continue Namenda -Per husband progressively worsening over past year, nonverbal at baseline    GERD without esophagitis -continue PPI    Depression due to dementia [facility](HCC) -continue home medication of Remeron    Thrombocytopenia (HCC) -monitoring stable  To be cachexia, malnutrition Body mass index is 14.78 kg/m. Consulting dietitian for nutrition supplements   Ethics: Discussed with her husband, current findings, plan of care and admission.  Also discussed CODE STATUS confirmed DNR/DNI status.  Also confirmed nonverbal at baseline, continue to decline. He expressed understanding and agreed to current plan. Palliative care consulted to determine goals of care.  Cultures:  -  Blood and sputum Cultures >> Antimicrobial: -Romycin, Rocephin  Consults called:  None -------------------------------------------------------------------------------------------------------------------------------------------- DVT prophylaxis: SCD/Compression stockings and Heparin SQ Code Status:   Code Status: DNR -confirmed by her husband   Admission  status: Patient will be admitted as Inpatient, with a greater than 2 midnight length of stay. Level of care: Telemetry   Family Communication:  Spuse Mr. Opaline Reyburn  (The above findings and plan of care has been discussed with patient in detail, the patient expressed understanding and agreement of above plan)  -------------------------------------------------------------------------------------------------------------------------------------------------  Disposition Plan: >3 days Status is: Inpatient  Remains inpatient appropriate because:Hemodynamically unstable, IV treatments appropriate due to intensity of illness or inability to take PO, and Inpatient level of care appropriate due to severity of illness  Dispo: The patient is from: Home              Anticipated d/c is to: Home              Patient currently is not medically stable to d/c.   Difficult to place patient No     ===================================================================================  Allergies  Allergen Reactions   Dexlansoprazole Other (See Comments)   Sodium Pentobarbital [Pentobarbital Sodium] Nausea And Vomiting    vitamin C  500 mg Oral Daily   baricitinib  2 mg Oral Daily   chlorpheniramine-HYDROcodone  5 mL Oral Q12H   donepezil  10 mg Oral QHS   feeding supplement  237 mL Oral TID BM   guaiFENesin-dextromethorphan  10 mL Oral Q8H   heparin  5,000 Units Subcutaneous Q8H   Ipratropium-Albuterol  1 puff Inhalation BID   loratadine  10 mg Oral Daily   mouth rinse  15 mL Mouth Rinse q12n4p   meloxicam  7.5 mg Oral Daily   memantine  28 mg Oral Daily   methylPREDNISolone (SOLU-MEDROL) injection  1 mg/kg Intravenous Q12H   Followed by   Melene Muller ON 09/01/2020] predniSONE  50 mg Oral Q breakfast   mirtazapine  45 mg Oral QHS   zinc sulfate  220 mg Oral Daily      PRN MEDs: acetaminophen, albuterol, ondansetron **OR** ondansetron (ZOFRAN) IV, oxyCODONE, polyethylene glycol, traZODone  Past  Medical History:  Diagnosis Date   Alzheimer disease (HCC) 01/18/2018   Chest pain, unspecified    Dysthymic disorder    Irritable bowel syndrome    Memory disorder 04/10/2013   Memory loss    Other malaise and fatigue    Pure hypercholesterolemia    Shortness of breath    Unspecified essential hypertension     Past Surgical History:  Procedure Laterality Date   APPENDECTOMY     CHOLECYSTECTOMY     INTRAMEDULLARY (IM) NAIL INTERTROCHANTERIC Left 02/19/2020   Procedure: Operative fixation left hip;  Surgeon: Oliver Barre, MD;  Location: [facility];  Service: Orthopedics;  Laterality: Left;   TONSILLECTOMY       reports that she has never smoked. She has never used smokeless tobacco. She reports that she does not drink alcohol and does not use drugs.   Family History  Problem Relation Age of Onset   Coronary artery disease Father 34       Died MI age 65   Alzheimer's disease Mother        Pacemaker   Coronary artery disease Brother 60       Heart attacks in his 55s    Physical Exam:   Vitals:   08/28/20 2127 08/28/20 2325 08/29/20 0434 08/29/20 0819  BP: (!) 129/109  125/85 112/89   Pulse: 83 75 (!) 58   Resp: 19 18 18    Temp: 98.4 F (36.9 C) 98.3 F (36.8 C) 98.2 F (36.8 C)   TempSrc: Oral Oral    SpO2: (!) 86% 94% 98% 97%  Weight:          Physical Exam:   General:  Minimal retraction, severe generalized cachexia,  HEENT:  Normocephalic, PERRL, otherwise with in Normal limits   Neuro:  Limited exam patient is awake but not following commands severe dementia  CNII-XII intact intact.,  Moving extremities in bed  Lungs:   Clear to auscultation BL, Respirations unlabored, no wheezes / crackles  Cardio:    S1/S2, RRR, No murmure, No Rubs or Gallops   Abdomen:   Soft, non-tender, bowel sounds active all four quadrants,  no guarding or peritoneal signs.  Muscular skeletal:  Severe global weakness  Limited exam - in bed, able to move all 4 extremities in bed, Normal  strength,  2+ pulses,  symmetric, No pitting edema  Skin:  Dry, warm to touch, negative for any Rashes,  Wounds: Please see nursing documentation             Labs on admission:    I have personally reviewed following labs and imaging studies  CBC: Recent Labs  Lab 08/28/20 1141 08/29/20 0625  WBC 7.9 8.5  NEUTROABS 7.1 7.7  HGB 15.4* 14.6  HCT 47.3* 45.3  MCV 89.6 89.3  PLT 201 226   Basic Metabolic Panel: Recent Labs  Lab 08/28/20 1141 08/29/20 0625  NA 144 146*  K 3.3* 3.8  CL 107 112*  CO2 25 23  GLUCOSE 129* 177*  BUN 41* 50*  CREATININE 0.87 0.78  CALCIUM 8.9 8.9   GFR: Estimated Creatinine Clearance: 44.3 mL/min (by C-G formula based on SCr of 0.78 mg/dL). Liver Function Tests: Lipid Profile: Recent Labs    08/28/20 1142  TRIG 81   Thyroid Function Tests: No results for input(s): TSH, T4TOTAL, FREET4, T3FREE, THYROIDAB in the last 72 hours. Anemia Panel: Recent Labs    08/28/20 1142 08/29/20 0625  FERRITIN 423* 471*   Urine analysis:    Component Value Date/Time   COLORURINE YELLOW 02/19/2020 0142   APPEARANCEUR CLOUDY (A) 02/19/2020 0142   LABSPEC 1.028 02/19/2020 0142   PHURINE 5.0 02/19/2020 0142   GLUCOSEU NEGATIVE 02/19/2020 0142   HGBUR MODERATE (A) 02/19/2020 0142   BILIRUBINUR NEGATIVE 02/19/2020 0142   KETONESUR 5 (A) 02/19/2020 0142   PROTEINUR 30 (A) 02/19/2020 0142   NITRITE POSITIVE (A) 02/19/2020 0142   LEUKOCYTESUR NEGATIVE 02/19/2020 0142     Radiologic Exams on Admission:   DG Chest Port 1 View  Result Date: 08/28/2020 CLINICAL DATA:  Difficulty arousing patient today, confusion at baseline but less responsive this morning, shortness of breath, entire family is COVID-19 positive EXAM: PORTABLE CHEST 1 VIEW COMPARISON:  Portable exam 1125 hours compared to 02/18/2020 FINDINGS: Normal heart size, mediastinal contours, and pulmonary vascularity. Emphysematous and chronic bronchitic changes consistent with COPD.  Patchy infiltrates RIGHT lung consistent with pneumonia. No pleural effusion or pneumothorax. Probable RIGHT nipple shadow. Bones demineralized. IMPRESSION: COPD changes with patchy RIGHT lung infiltrates consistent with pneumonia. Electronically Signed   By: 04/17/2020 M.D.   On: 08/28/2020 11:57    EKG:   Independently reviewed.  Orders placed or performed during the hospital encounter of 08/28/20   EKG 12-Lead   EKG 12-Lead   ED EKG 12-Lead   ED  EKG 12-Lead   EKG   ---------------------------------------------------------------------------------------------------------------------------------------   ----------------------------------------------------------------------------------------------------------------------------------------------  Time spent: > than  55  Min.   SIGNED: Kendell Bane, MD, FHM. [facility],  Pager (Please use [email or web address] to page to text)  If 7PM-7AM, please contact night-coverage [email or web address],  08/29/2020, 1:00 PM

## 2020-08-30 LAB — CBC WITH DIFFERENTIAL/PLATELET
Abs Immature Granulocytes: 0.07 10*3/uL (ref 0.00–0.07)
Basophils Absolute: 0 10*3/uL (ref 0.0–0.1)
Basophils Relative: 0 %
Eosinophils Absolute: 0 10*3/uL (ref 0.0–0.5)
Eosinophils Relative: 0 %
HCT: 44.1 % (ref 36.0–46.0)
Hemoglobin: 14.1 g/dL (ref 12.0–15.0)
Immature Granulocytes: 1 %
Lymphocytes Relative: 5 %
Lymphs Abs: 0.4 10*3/uL — ABNORMAL LOW (ref 0.7–4.0)
MCH: 28.4 pg (ref 26.0–34.0)
MCHC: 32 g/dL (ref 30.0–36.0)
MCV: 88.7 fL (ref 80.0–100.0)
Monocytes Absolute: 0.4 10*3/uL (ref 0.1–1.0)
Monocytes Relative: 5 %
Neutro Abs: 7 10*3/uL (ref 1.7–7.7)
Neutrophils Relative %: 89 %
Platelets: 223 10*3/uL (ref 150–400)
RBC: 4.97 MIL/uL (ref 3.87–5.11)
RDW: 14.6 % (ref 11.5–15.5)
WBC: 7.9 10*3/uL (ref 4.0–10.5)
nRBC: 0 % (ref 0.0–0.2)

## 2020-08-30 LAB — COMPREHENSIVE METABOLIC PANEL
ALT: 34 U/L (ref 0–44)
AST: 30 U/L (ref 15–41)
Albumin: 2.7 g/dL — ABNORMAL LOW (ref 3.5–5.0)
Alkaline Phosphatase: 100 U/L (ref 38–126)
Anion gap: 8 (ref 5–15)
BUN: 46 mg/dL — ABNORMAL HIGH (ref 8–23)
CO2: 29 mmol/L (ref 22–32)
Calcium: 9.4 mg/dL (ref 8.9–10.3)
Chloride: 109 mmol/L (ref 98–111)
Creatinine, Ser: 0.67 mg/dL (ref 0.44–1.00)
GFR, Estimated: >60 mL/min (ref >60)
Glucose, Bld: 187 mg/dL — ABNORMAL HIGH (ref 70–99)
Potassium: 3.4 mmol/L — ABNORMAL LOW (ref 3.5–5.1)
Sodium: 146 mmol/L — ABNORMAL HIGH (ref 135–145)
Total Bilirubin: 0.9 mg/dL (ref 0.3–1.2)
Total Protein: 6.1 g/dL — ABNORMAL LOW (ref 6.5–8.1)

## 2020-08-30 LAB — C-REACTIVE PROTEIN: CRP: 13.7 mg/dL — ABNORMAL HIGH (ref <1.0)

## 2020-08-30 LAB — FERRITIN: Ferritin: 462 ng/mL — ABNORMAL HIGH (ref 11–307)

## 2020-08-30 LAB — D-DIMER, QUANTITATIVE: D-Dimer, Quant: 0.92 ug/mL-FEU — ABNORMAL HIGH (ref 0.00–0.50)

## 2020-08-30 MED ORDER — POTASSIUM CL IN DEXTROSE 5% 20 MEQ/L IV SOLN
20.0000 meq | INTRAVENOUS | Status: DC
  Administered 2020-08-30 (×2): 20 meq via INTRAVENOUS

## 2020-08-30 MED ORDER — METHYLPREDNISOLONE SODIUM SUCC 40 MG IJ SOLR
40.0000 mg | Freq: Two times a day (BID) | INTRAMUSCULAR | Status: DC
  Administered 2020-08-30 – 2020-08-31 (×4): 40 mg via INTRAVENOUS
  Filled 2020-08-30 (×2): qty 1

## 2020-08-30 NOTE — Progress Notes (Addendum)
Daily Progress Note   Patient Name: Destiny Lopez       Date: 08/30/2020 DOB: 1950-07-08  Age: 70 y.o. MRN#: [id number] Attending Physician: Kendell Bane, MD Primary Care Physician: [name], MD Admit Date: 08/28/2020 Length of Stay: 2 days  Reason for Consultation/Follow-up: Establishing goals of care  HPI/Patient Profile:  70 y.o. female  with past medical history of advanced Alzheimer's dementia, noncommunicating, hypertension, hyperlipidemia, GERD, depression with dementia, thrombocytopenia, osteopenia admitted on 08/28/2020 with COVID-19+, pneumonia, hypoxia.  Per the patient's husband her home O2 sat ranges 86 to 90%.  The patient and her husband were both positive for COVID-19, as well as their son who tested positive several days before them.  Her husband states that both he and she slept for most 48 hours.  He woke up yesterday, and the patient was not very arousable.  At that point he called EMS because her O2 sat was 84%.  She improved 94% on nasal cannula oxygen.  X-ray completed in the ED found right lung infiltrate consistent with pneumonia as well as COPD changes.   The patient is end-stage Alzheimer's and 2 or 3 months ago she worsened from answering yes/no at times to being essentially nonverbal.  She is not aware of her current situation, environment.  She is being admitted for treatment of COVID-pneumonia with plans to discharge back home.  Current Medications: Scheduled Meds:   vitamin C  500 mg Oral Daily   baricitinib  2 mg Oral Daily   chlorpheniramine-HYDROcodone  5 mL Oral Q12H   donepezil  10 mg Oral QHS   feeding supplement  237 mL Oral TID BM   guaiFENesin-dextromethorphan  10 mL Oral Q8H   heparin  5,000 Units Subcutaneous Q8H   loratadine  10 mg Oral Daily   mouth rinse  15 mL Mouth Rinse q12n4p   meloxicam  7.5 mg Oral Daily   memantine  28 mg Oral Daily   methylPREDNISolone (SOLU-MEDROL) injection  40 mg Intravenous Q12H   mirtazapine  45 mg  Oral QHS   zinc sulfate  220 mg Oral Daily    Continuous Infusions:  azithromycin 500 mg (08/30/20 1225)   cefTRIAXone (ROCEPHIN)  IV 1 g (08/30/20 0824)   dextrose 5 % with KCl 20 mEq / L 20 mEq (08/30/20 0957)   remdesivir 100 mg in NS 100 mL 100 mg (08/30/20 0958)    PRN Meds: acetaminophen, albuterol, ALPRAZolam, haloperidol lactate, Ipratropium-Albuterol, ondansetron **OR** ondansetron (ZOFRAN) IV, oxyCODONE, polyethylene glycol, traZODone  Subjective:   Subjective: Chart Reviewed. Updates received. Patient Assessed. Created space and opportunity for patient  and family to explore thoughts and feelings regarding current medical situation.  Today's Discussion: Patient is unable to have meaningful conversation. I attempted to call the patient's husband and there was no answer, mailbox full/unable to leave a message.  Review of Systems  Unable to perform ROS: Dementia   Objective:   Vital Signs: BP 115/63 (BP Location: Right Arm)   Pulse 71   Temp 98 F (36.7 C) (Oral)   Resp 17   Wt 44.1 kg   SpO2 99%   BMI 14.78 kg/m  SpO2: SpO2: 99 % O2 Device: O2 Device: Nasal Cannula O2 Flow Rate: O2 Flow Rate (L/min): 2 L/min  Physical Exam: Physical Exam Constitutional:      General: She is sleeping.  HENT:     Head: Normocephalic and atraumatic.  Cardiovascular:     Rate and Rhythm: Normal rate.  Pulmonary:  Effort: Pulmonary effort is normal.     Breath sounds: Normal breath sounds.  Abdominal:     General: Abdomen is flat.     Palpations: Abdomen is soft.  Skin:    General: Skin is warm and dry.    SpO2: SpO2: 99 % O2 Device:SpO2: 99 % O2 Flow Rate: .O2 Flow Rate (L/min): 2 L/min  IO: Intake/output summary:  Intake/Output Summary (Last 24 hours) at 08/30/2020 1304 Last data filed at 08/30/2020 0900 Gross per 24 hour  Intake 1474.87 ml  Output --  Net 1474.87 ml    LBM: Last BM Date: 08/29/20 Baseline Weight: Weight: 52.2 kg Most recent weight:  Weight: 44.1 kg   Palliative Assessment/Data: 20-30%   Assessment & Plan:   Impression:   Advanced Alzheimer's COVID-19 pneumonia   Confused 69 year old female with advanced Alzheimer's now with COVID-19 pneumonia and hypoxia.  She is sleepy but arousable.  She does not appear to be in any significant distress.  Does not appear to be in pain.  No moaning, groaning, or grimacing noted on her exam today.  Her vitals have been stable.  I feel the patient would be a good candidate for home hospice given her advanced dementia, as an extra level of support for her family. However, I was unable to reach the family to discuss this.  SUMMARY OF RECOMMENDATIONS   Remain DNR Treat the treatable (especially COVID-19 pneumonia) Safety precautions related to confusion Delirium precautions Further decisions/goals of care discussion pending clinical progress (attempted today) Would likely benefit from either home health hospice or home health palliative care consult at discharge PMT will continue to follow and support holistically  Code Status: DNR  Symptom Management: Per primary team Palliative care is available to assist with symptom management as needed No identifiable needs at this time as patient is not confused, agitated, dyspneic, and apparent pain  Prognosis: < 6 months  Discharge Planning: To Be Determined  Discussed with: Medical team, nursing staff  Thank you for allowing [place] to participate in the care of AIZA VOLLRATH PMT will continue to support holistically.  Time Total: 25 min  Visit consisted of counseling and education dealing with the complex and emotionally intense issues of symptom management and palliative care in the setting of serious and potentially life-threatening illness. Greater than 50%  of this time was spent counseling and coordinating care related to the above assessment and plan.  Wynne Dust, NP Palliative Medicine Team  Team Phone # [phone]  (Nights/Weekends)  08/30/2020, 1:04 PM

## 2020-08-30 NOTE — Progress Notes (Signed)
Patient alert this morning and was fed breakfast , she also consumed medications crushed with applesauce , followed by a few spoonful's of ensure. Repositioned throughout shift. Patient sleepy most of day . Her husband [name] called for update and states that is patients baseline at home. Husband thankful for the update this Clinical research associate provided and for the care patient is receiving during hospital stay . Reminded her husband he is welcome to call at anytime for update. Patient unable to consume 2pm meds d/t being asleep . Hob at 30* . Will continue to monitor

## 2020-08-30 NOTE — Care Management Important Message (Signed)
Important Message  Patient Details  Name: Destiny Lopez MRN: [id number] Date of Birth: March 14, 1950   Medicare Important Message Given:  Yes (RN, Lauren agreed to deliver letter due to contact precautions)     Corey Harold 08/30/2020, 3:42 PM

## 2020-08-30 NOTE — Progress Notes (Signed)
  Speech Language Pathology Treatment: Dysphagia  Patient Details Name: Destiny Lopez MRN: [id number] DOB: [birth year] Today's Date: 08/30/2020 Time: 5997-7414 SLP Time Calculation (min) (ACUTE ONLY): 17 min  Assessment / Plan / Recommendation Clinical Impression  Ongoing dysphagia therapy provided with Pt's breakfast tray while NT fed D2/fine chop and thin liquids. Pt consumed thin liquids without overt s/sx of aspiration via straw with encouragement and support and requiring straw tip to be presented to lips. With D2/soft textures Pt demonstrated oral holding and lack of manipulation of bolus this am. As suggested in BSE, at this time downgrade to D1/puree seems to be appropriate to ensure maximal PO intake and decrease risk of holding large pieces of solid textures in oral cavity. Staff is concerned about oral holding and reporting it with meals since initiation of D2 diet. Even with downgrade to D1/puree continue recommendation for 1:1 feeder and gentle support. Further ensure Pt is alert and attentive to feeding and facilitate Pt engagement with feeding as much as possible. ST will continue to follow acutely, thank you.     HPI HPI: 70 y.o. female  with past medical history of advanced Alzheimer's dementia, noncommunicating, hypertension, hyperlipidemia, GERD, depression with dementia, thrombocytopenia, osteopenia admitted on 08/28/2020 with COVID-19+, pneumonia, hypoxia.  Per the patient's husband her home O2 sat ranges 86 to 90%.  The patient and her husband were both positive for COVID-19, as well as their son who tested positive several days before them.  Her husband states that both he and she slept for most 48 hours.  He woke up yesterday, and the patient was not very arousable.  At that point he called EMS because her O2 sat was 84%.  She improved 94% on nasal cannula oxygen.  X-ray completed in the ED found right lung infiltrate consistent with pneumonia as well as COPD changes. The patient  is end-stage Alzheimer's and 2 or 3 months ago she worsened from answering yes/no at times to being essentially nonverbal.  She is not aware of her current situation, environment.  She is being admitted for treatment of COVID-pneumonia with plans to discharge back home. BSE requested.      SLP Plan  Continue with current plan of care       Recommendations  Diet recommendations: Dysphagia 1 (puree);Thin liquid Liquids provided via: Cup;Straw Medication Administration: Crushed with puree Supervision: Full supervision/cueing for compensatory strategies Compensations: Slow rate;Small sips/bites;Lingual sweep for clearance of pocketing Postural Changes and/or Swallow Maneuvers: Seated upright 90 degrees;Upright 30-60 min after meal                Oral Care Recommendations: Oral care before and after PO;Staff/trained caregiver to provide oral care Follow up Recommendations: 24 hour supervision/assistance SLP Visit Diagnosis: Dysphagia, unspecified (R13.10) Plan: Continue with current plan of care       Destiny Lopez. Destiny Lopez, CCC-SLP Speech Language Pathologist        Destiny Lopez 08/30/2020, 9:22 AM

## 2020-08-30 NOTE — Progress Notes (Signed)
Progress note  Patient: Destiny Lopez                            PCP: [name], MD                    DOB: 06-22-1950            DOA: 08/28/2020 UVO:[id number]             DOS: 08/30/2020, 11:48 AM  [name], MD  Patient coming from:   HOME  I have personally reviewed patient's medical records, in electronic medical records, including:  [facility], and care everywhere.    CC: Altered mental status, shortness of breath, COVID-positive  Subjective     The patient was seen and examined this morning, much more awake interactive, trying to speak. Otherwise nonverbal at baseline... Per nursing staff with much encouragement does take some p.o. Status post evaluation by speech-was recommended dysphagia 2 thin liquid diet   Hospital course:     ZYANNA Lopez is a 70 y.o. female with medical history significant of advanced Alzheimer's dementia, noncommunicating, hypertension, hyperlipidemia, GERD, depression with dementia, thrombocytopenia, osteopenia... Scented from home via EMS, for hypoxia, worsening mental status recent SARS-CoV-2 positive.  Called patient's POA husband Mr. [name] who confirmed the patient has advanced dementia nonverbal starting this year, her mental status progressively got worse with noted hypoxia O2 sat ranging 86 to 90% at home.  He confirms that the household ,husband, son and the patient is vaccinated with 1 boost.   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)   Thrombocytopenia (HCC)   SARS-CoV-2 virus infection with hypoxia/pneumonia -Remained stable --improved respite distress, remains on 2 L of oxygen, satting 99% -  -According to the husband at home satting 86 to 90%, not O2 dependent -WBC 7.9, hemoglobin  15.4, platelets 201, potassium 3.3, BUN 41, glucose 129, lactic acid 2.0, procalcitonin 0.68, LDL 227, ferritin 423, CRP three 3.8, D-dimer 1.8, fibrinogen> 800, - SARS-CoV-2 positive, X-ray revealing right lung infiltrate, consistent with pneumonia, COPD ch -Initiating remdesivir,   -We will continue with IV steroids with quick taper, and IV remdesivir -Empiric antibiotics Rocephin, azithromycin.. -We will continue supplemental oxygen with goal to taper off to room air  Dysphagia -Exacerbated by poor cognition, advancing dementia -Status post detailed evaluation by speech, recommending dysphagia 2 diet With aspiration precaution -Continue gentle IV fluid hydration -Dietitian consulted appreciate recommendations   Active Problems:    Essential hypertension -blood pressure remained stable without medication    Pure hypercholesterolemia -do not recommend statins due to muscle wasting, risks outweighs the benefit    Anxiety and depression -as needed Xanax, stable    Alzheimer disease (HCC)  -Continue Namenda -Per husband progressively worsening over past year, nonverbal at baseline    GERD without esophagitis -continue PPI    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 Consulting dietitian for nutrition supplements -Continue dietary supplements  Ethics: Discussed with her husband, current findings, plan of care and admission.  Also discussed CODE STATUS confirmed  DNR/DNI status.  Also confirmed nonverbal at baseline, continue to decline. He expressed understanding and agreed to current plan. Palliative care consulted to determine goals of care.  Cultures:  -Blood and sputum Cultures >> Antimicrobial: -Romycin, Rocephin  Consults called:   None -------------------------------------------------------------------------------------------------------------------------------------------- DVT prophylaxis: SCD/Compression stockings and Heparin SQ Code Status:   Code Status: DNR -confirmed by her husband   Admission status: Patient will be admitted as Inpatient, with a greater than 2 midnight length of stay. Level of care: Telemetry   Family Communication:  Spuse Mr. [name]  (The above findings and plan of care has been discussed with patient in detail, the patient expressed understanding and agreement of above plan)  -------------------------------------------------------------------------------------------------------------------------------------------------  Disposition Plan: >3 days Status is: Inpatient  Remains inpatient appropriate because:Hemodynamically unstable, IV treatments appropriate due to intensity of illness or inability to take PO, and Inpatient level of care appropriate due to severity of illness  Dispo: The patient is from: Home              Anticipated d/c is to: Home              Patient currently is not medically stable to d/c.   Difficult to place patient No     ===================================================================================  Allergies  Allergen Reactions   Dexlansoprazole Other (See Comments)   Sodium Pentobarbital [Pentobarbital Sodium] Nausea And Vomiting    vitamin C  500 mg Oral Daily   baricitinib  2 mg Oral Daily   chlorpheniramine-HYDROcodone  5 mL Oral Q12H   donepezil  10 mg Oral QHS   feeding supplement  237 mL Oral TID BM   guaiFENesin-dextromethorphan  10 mL Oral Q8H   heparin  5,000 Units Subcutaneous Q8H   loratadine  10 mg Oral Daily   mouth rinse  15 mL Mouth Rinse q12n4p   meloxicam  7.5 mg Oral Daily   memantine  28 mg Oral Daily   methylPREDNISolone (SOLU-MEDROL) injection  40 mg Intravenous Q12H   mirtazapine  45 mg Oral QHS   zinc sulfate   220 mg Oral Daily      PRN MEDs: acetaminophen, albuterol, ALPRAZolam, haloperidol lactate, Ipratropium-Albuterol, ondansetron **OR** ondansetron (ZOFRAN) IV, oxyCODONE, polyethylene glycol, traZODone  Past Medical History:  Diagnosis Date   Alzheimer disease (HCC) 01/18/2018   Chest pain, unspecified    Dysthymic disorder    Irritable bowel syndrome    Memory disorder 04/10/2013   Memory loss    Other malaise and fatigue    Pure hypercholesterolemia    Shortness of breath    Unspecified essential hypertension     Past Surgical History:  Procedure Laterality Date   APPENDECTOMY     CHOLECYSTECTOMY     INTRAMEDULLARY (IM) NAIL INTERTROCHANTERIC Left 02/19/2020   Procedure: Operative fixation left hip;  Surgeon: Oliver Barre, MD;  Location: [facility];  Service: Orthopedics;  Laterality: Left;   TONSILLECTOMY       reports that she has never smoked. She has never used smokeless tobacco. She reports that she does not drink alcohol and does not use drugs.   Family History  Problem Relation Age of Onset   Coronary artery disease Father 65       Died MI age 4   Alzheimer's disease Mother        Pacemaker   Coronary artery disease Brother 10       Heart attacks in his 67s    Physical Exam:   Vitals:   08/29/20 [phone]  08/29/20 1407 08/29/20 2106 08/30/20 0442  BP:  132/83 (!) 104/59 115/63  Pulse:  67 69 71  Resp:  17 18 17   Temp:  98.4 F (36.9 C) (!) 97.5 F (36.4 C) 98 F (36.7 C)  TempSrc:   Oral Oral  SpO2: 97% 91% 98% 99%  Weight:           Physical Exam:   General:  More interactive today, nonverbal-at baseline -severe cachexia  HEENT:  Normocephalic, PERRL, otherwise with in Normal limits   Neuro:  Nonverbal due to advanced dementia CNII-XII intact. , normal motor and sensation, reflexes intact   Lungs:   Clear to auscultation BL, Respirations unlabored, no wheezes / crackles  Cardio:    S1/S2, RRR, No murmure, No Rubs or Gallops   Abdomen:   Soft,  non-tender, bowel sounds active all four quadrants,  no guarding or peritoneal signs.  Muscular skeletal:  Generalized muscle wasting, cachexia, global generalized weaknesses,  Otherwise Limited exam - in bed, able to move all 4 extremities,  2+ pulses,  symmetric, No pitting edema  Skin:  Dry, warm to touch, negative for any Rashes,  Wounds: Please see nursing documentation                 Labs on admission:    I have personally reviewed following labs and imaging studies  CBC: Recent Labs  Lab 08/28/20 1141 08/29/20 0625 08/30/20 0521  WBC 7.9 8.5 7.9  NEUTROABS 7.1 7.7 7.0  HGB 15.4* 14.6 14.1  HCT 47.3* 45.3 44.1  MCV 89.6 89.3 88.7  PLT 201 226 223   Basic Metabolic Panel: Recent Labs  Lab 08/28/20 1141 08/29/20 0625 08/30/20 0521  NA 144 146* 146*  K 3.3* 3.8 3.4*  CL 107 112* 109  CO2 25 23 29   GLUCOSE 129* 177* 187*  BUN 41* 50* 46*  CREATININE 0.87 0.78 0.67  CALCIUM 8.9 8.9 9.4   GFR: Estimated Creatinine Clearance: 44.3 mL/min (by C-G formula based on SCr of 0.67 mg/dL). Liver Function Tests: Lipid Profile: Recent Labs    08/28/20 1142  TRIG 81   Thyroid Function Tests: No results for input(s): TSH, T4TOTAL, FREET4, T3FREE, THYROIDAB in the last 72 hours. Anemia Panel: Recent Labs    08/29/20 0625 08/30/20 0521  FERRITIN 471* 462*   Urine analysis:    Component Value Date/Time   COLORURINE YELLOW 02/19/2020 0142   APPEARANCEUR CLOUDY (A) 02/19/2020 0142   LABSPEC 1.028 02/19/2020 0142   PHURINE 5.0 02/19/2020 0142   GLUCOSEU NEGATIVE 02/19/2020 0142   HGBUR MODERATE (A) 02/19/2020 0142   BILIRUBINUR NEGATIVE 02/19/2020 0142   KETONESUR 5 (A) 02/19/2020 0142   PROTEINUR 30 (A) 02/19/2020 0142   NITRITE POSITIVE (A) 02/19/2020 0142   LEUKOCYTESUR NEGATIVE 02/19/2020 0142     Radiologic Exams on Admission:   No results found.  EKG:   Independently reviewed.  Orders placed or performed during the hospital encounter of  08/28/20   EKG 12-Lead   EKG 12-Lead   ED EKG 12-Lead   ED EKG 12-Lead   EKG   ---------------------------------------------------------------------------------------------------------------------------------------   ----------------------------------------------------------------------------------------------------------------------------------------------  Time spent: > than  55  Min.   SIGNED: 04/18/2020, MD, FHM. [facility],  Pager (Please use [email or web address] to page to text)  If 7PM-7AM, please contact night-coverage [email or web address],  08/30/2020, 11:48 AM

## 2020-08-31 LAB — FERRITIN: Ferritin: 344 ng/mL — ABNORMAL HIGH (ref 11–307)

## 2020-08-31 LAB — COMPREHENSIVE METABOLIC PANEL
ALT: 33 U/L (ref 0–44)
AST: 26 U/L (ref 15–41)
Albumin: 2.8 g/dL — ABNORMAL LOW (ref 3.5–5.0)
Alkaline Phosphatase: 100 U/L (ref 38–126)
Anion gap: 4 — ABNORMAL LOW (ref 5–15)
BUN: 41 mg/dL — ABNORMAL HIGH (ref 8–23)
CO2: 27 mmol/L (ref 22–32)
Calcium: 9.3 mg/dL (ref 8.9–10.3)
Chloride: 109 mmol/L (ref 98–111)
Creatinine, Ser: 0.65 mg/dL (ref 0.44–1.00)
GFR, Estimated: >60 mL/min (ref >60)
Glucose, Bld: 173 mg/dL — ABNORMAL HIGH (ref 70–99)
Potassium: 5.4 mmol/L — ABNORMAL HIGH (ref 3.5–5.1)
Sodium: 140 mmol/L (ref 135–145)
Total Bilirubin: 0.5 mg/dL (ref 0.3–1.2)
Total Protein: 6.4 g/dL — ABNORMAL LOW (ref 6.5–8.1)

## 2020-08-31 LAB — CBC WITH DIFFERENTIAL/PLATELET
Abs Immature Granulocytes: 0.06 10*3/uL (ref 0.00–0.07)
Basophils Absolute: 0 10*3/uL (ref 0.0–0.1)
Basophils Relative: 1 %
Eosinophils Absolute: 0 10*3/uL (ref 0.0–0.5)
Eosinophils Relative: 0 %
HCT: 44.9 % (ref 36.0–46.0)
Hemoglobin: 14.3 g/dL (ref 12.0–15.0)
Immature Granulocytes: 1 %
Lymphocytes Relative: 11 %
Lymphs Abs: 0.7 10*3/uL (ref 0.7–4.0)
MCH: 29.1 pg (ref 26.0–34.0)
MCHC: 31.8 g/dL (ref 30.0–36.0)
MCV: 91.4 fL (ref 80.0–100.0)
Monocytes Absolute: 0.4 10*3/uL (ref 0.1–1.0)
Monocytes Relative: 6 %
Neutro Abs: 5.1 10*3/uL (ref 1.7–7.7)
Neutrophils Relative %: 81 %
Platelets: 236 10*3/uL (ref 150–400)
RBC: 4.91 MIL/uL (ref 3.87–5.11)
RDW: 14.9 % (ref 11.5–15.5)
WBC: 6.3 10*3/uL (ref 4.0–10.5)
nRBC: 0 % (ref 0.0–0.2)

## 2020-08-31 LAB — C-REACTIVE PROTEIN: CRP: 5.5 mg/dL — ABNORMAL HIGH (ref <1.0)

## 2020-08-31 LAB — D-DIMER, QUANTITATIVE: D-Dimer, Quant: 0.92 ug/mL-FEU — ABNORMAL HIGH (ref 0.00–0.50)

## 2020-08-31 MED ORDER — DEXTROSE 5 % IV SOLN: INTRAVENOUS | Status: DC

## 2020-08-31 NOTE — Progress Notes (Signed)
This nurse attempted  several times to give pt oral medications and fed her breakfast. Pt s refusing to swallow. Multiple attempts made and tried to coach pt to swallow. MD notified. Pt was reported to have stayed awake  all night and has been a sleep all morning. Will attempt at a later time.

## 2020-08-31 NOTE — Progress Notes (Signed)
Progress note  Patient: Destiny Lopez                            PCP: [name], MD                    DOB: [birth year]            DOA: 08/28/2020 [id number]:[id number]             DOS: 08/31/2020, 1:04 PM  [name], MD  Patient coming from:   HOME  I have personally reviewed patient's medical records, in electronic medical records, including:  [facility], and care everywhere.    CC: Altered mental status, shortness of breath, COVID-positive  Subjective   The patient was seen and examined this morning, awake, not much interactive Per nursing staff refused to take her oral medication and breakfast this morning    Hospital course:     Destiny Lopez is a 70 y.o. female with medical history significant of advanced Alzheimer's dementia, noncommunicating, hypertension, hyperlipidemia, GERD, depression with dementia, thrombocytopenia, osteopenia... Scented from home via EMS, for hypoxia, worsening mental status recent SARS-CoV-2 positive.  Called patient's POA husband Destiny Lopez who confirmed the patient has advanced dementia nonverbal starting this year, her mental status progressively got worse with noted hypoxia O2 sat ranging 86 to 90% at home.  He confirms that the household ,husband, son and the patient is vaccinated with 1 boost.   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)   Thrombocytopenia (HCC)   SARS-CoV-2 virus infection with hypoxia/pneumonia -Hemodynamically stable, improved hypoxia, satting 94% on 2.5 L of oxygen -  -According to the husband at home satting 86 to 90%, not O2 dependent -WBC 7.9, hemoglobin 15.4, platelets 201, potassium 3.3, BUN 41, glucose 129, lactic acid 2.0, procalcitonin 0.68, LDL 227, ferritin 423, CRP  three 3.8, D-dimer 1.8, fibrinogen> 800, - SARS-CoV-2 positive, X-ray revealing right lung infiltrate, consistent with pneumonia, COPD ch -Initiating remdesivir,   -We will continue with IV steroids with quick taper, and IV remdesivir -Empiric antibiotics Rocephin, azithromycin.. -We will continue supplemental oxygen with goal to taper off to room air  Dysphagia  -Exacerbated by poor cognition, advancing dementia -Status post detailed evaluation by speech, recommending dysphagia 2 diet With aspiration precaution -Continue gentle IV fluid hydration -Dietitian consulted appreciate recommendations -Encouraging patient to eat with assist of nursing staff Refusing to take her meds or p.o. diet at this     Active Problems:    Essential hypertension -stable without medication    Pure hypercholesterolemia -do not recommend statins due to muscle wasting, risks outweighs the benefit    Anxiety and depression -as needed Xanax, stable    Alzheimer disease (HCC)  -Worsening cognition and behavior refusing p.o. 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 Consulting dietitian for nutrition supplements -Continue dietary supplements -Encourage increased p.o. intake with dietary supplements-at this point refusing  Ethics: Discussed with her husband, current findings, plan of care and admission.  Also discussed CODE STATUS confirmed DNR/DNI status.  Also confirmed nonverbal at baseline, continue to decline. He expressed understanding and agreed to current plan. Palliative care consulted to determine goals of care.  Cultures:  -Blood and sputum Cultures  >> Antimicrobial: -Romycin, Rocephin  Consults called:  None -------------------------------------------------------------------------------------------------------------------------------------------- DVT prophylaxis: SCD/Compression stockings and Heparin SQ Code Status:   Code Status: DNR -confirmed by her husband   Admission status: Patient will be admitted as Inpatient, with a greater than 2 midnight length of stay. Level of care: Telemetry   Family Communication:  Spuse Mr. Destiny Lopez  (The above findings and plan of care has been discussed with patient in detail, the patient expressed understanding and agreement of above plan)  -------------------------------------------------------------------------------------------------------------------------------------------------  Disposition Plan: >3 days Status is: Inpatient  Remains inpatient appropriate because:Hemodynamically unstable, IV treatments appropriate due to intensity of illness or inability to take PO, and Inpatient level of care appropriate due to severity of illness  Dispo: The patient is from: Home              Anticipated d/c is to: Home              Patient currently is not medically stable to d/c.   Difficult to place patient No     ===================================================================================  Allergies  Allergen Reactions   Dexlansoprazole Other (See Comments)   Sodium Pentobarbital [Pentobarbital Sodium] Nausea And Vomiting    vitamin C  500 mg Oral Daily   baricitinib  2 mg Oral Daily   chlorpheniramine-HYDROcodone  5 mL Oral Q12H   donepezil  10 mg Oral QHS   feeding supplement  237 mL Oral TID BM   guaiFENesin-dextromethorphan  10 mL Oral Q8H   heparin  5,000 Units Subcutaneous Q8H   loratadine  10 mg Oral Daily   mouth rinse  15 mL Mouth Rinse q12n4p   meloxicam  7.5 mg Oral Daily   memantine  28 mg Oral Daily   methylPREDNISolone (SOLU-MEDROL) injection  40 mg  Intravenous Q12H   mirtazapine  45 mg Oral QHS   zinc sulfate  220 mg Oral Daily      PRN MEDs: acetaminophen, albuterol, ALPRAZolam, haloperidol lactate, Ipratropium-Albuterol, ondansetron **OR** ondansetron (ZOFRAN) IV, oxyCODONE, polyethylene glycol, traZODone  Past Medical History:  Diagnosis Date   Alzheimer disease (HCC) 01/18/2018   Chest pain, unspecified    Dysthymic disorder    Irritable bowel syndrome    Memory disorder 04/10/2013   Memory loss    Other malaise and fatigue    Pure hypercholesterolemia    Shortness of breath    Unspecified essential hypertension     Past Surgical History:  Procedure Laterality Date   APPENDECTOMY     CHOLECYSTECTOMY     INTRAMEDULLARY (IM) NAIL INTERTROCHANTERIC Left 02/19/2020   Procedure: Operative fixation left hip;  Surgeon: Oliver Barre, MD;  Location: [facility];  Service: Orthopedics;  Laterality: Left;   TONSILLECTOMY       reports that she has never smoked. She has never used smokeless tobacco. She reports that she does not drink alcohol and does not use drugs.   Family History  Problem Relation Age of Onset   Coronary artery disease Father 23       Died MI age 62   Alzheimer's disease Mother        Pacemaker   Coronary artery disease Brother 76  Heart attacks in his 40s    Physical Exam:   Vitals:   08/30/20 0442 08/30/20 1427 08/30/20 1947 08/31/20 0505  BP: 115/63 124/61 (!) 143/63 111/61  Pulse: 71 76 64 63  Resp: 17 16 18 18   Temp: 98 F (36.7 C) 98.2 F (36.8 C) 98.1 F (36.7 C) 98.1 F (36.7 C)  TempSrc: Oral Axillary    SpO2: 99% 97% 100% 95%  Weight:            Physical Exam:   General:  Awake, not much interactive today -cachectic  HEENT:  Normocephalic, PERRL, otherwise with in Normal limits   Neuro:  Limited exam due to severe dementia CNII-XII intact. , normal motor and sensation, reflexes intact   Lungs:   Clear to auscultation BL, Respirations unlabored, no wheezes / crackles Mild  rhonchi diffusely mid to lower lobe  Cardio:    S1/S2, RRR, No murmure, No Rubs or Gallops   Abdomen:   Soft, non-tender, bowel sounds active all four quadrants,  no guarding or peritoneal signs.  Muscular skeletal:  Global generalized weaknesses, generalized muscle wasting  Limited exam - in bed, able to move all 4 extremities,  2+ pulses,  symmetric, No pitting edema  Skin:  Dry, warm to touch, negative for any Rashes,  Wounds: Please see nursing documentation                 Labs on admission:    I have personally reviewed following labs and imaging studies  CBC: Recent Labs  Lab 08/28/20 1141 08/29/20 0625 08/30/20 0521 08/31/20 0721  WBC 7.9 8.5 7.9 6.3  NEUTROABS 7.1 7.7 7.0 5.1  HGB 15.4* 14.6 14.1 14.3  HCT 47.3* 45.3 44.1 44.9  MCV 89.6 89.3 88.7 91.4  PLT 201 226 223 236   Basic Metabolic Panel: Recent Labs  Lab 08/28/20 1141 08/29/20 0625 08/30/20 0521 08/31/20 0721  NA 144 146* 146* 140  K 3.3* 3.8 3.4* 5.4*  CL 107 112* 109 109  CO2 25 23 29 27   GLUCOSE 129* 177* 187* 173*  BUN 41* 50* 46* 41*  CREATININE 0.87 0.78 0.67 0.65  CALCIUM 8.9 8.9 9.4 9.3   GFR: Estimated Creatinine Clearance: 44.3 mL/min (by C-G formula based on SCr of 0.65 mg/dL). Liver Function Tests: Lipid Profile: No results for input(s): CHOL, HDL, LDLCALC, TRIG, CHOLHDL, LDLDIRECT in the last 72 hours.  Thyroid Function Tests: No results for input(s): TSH, T4TOTAL, FREET4, T3FREE, THYROIDAB in the last 72 hours. Anemia Panel: Recent Labs    08/30/20 0521 08/31/20 0721  FERRITIN 462* 344*   Urine analysis:    Component Value Date/Time   COLORURINE YELLOW 02/19/2020 0142   APPEARANCEUR CLOUDY (A) 02/19/2020 0142   LABSPEC 1.028 02/19/2020 0142   PHURINE 5.0 02/19/2020 0142   GLUCOSEU NEGATIVE 02/19/2020 0142   HGBUR MODERATE (A) 02/19/2020 0142   BILIRUBINUR NEGATIVE 02/19/2020 0142   KETONESUR 5 (A) 02/19/2020 0142   PROTEINUR 30 (A) 02/19/2020 0142    NITRITE POSITIVE (A) 02/19/2020 0142   LEUKOCYTESUR NEGATIVE 02/19/2020 0142     Radiologic Exams on Admission:   No results found.  EKG:   Independently reviewed.  Orders placed or performed during the hospital encounter of 08/28/20   EKG 12-Lead   EKG 12-Lead   ED EKG 12-Lead   ED EKG 12-Lead   EKG   ---------------------------------------------------------------------------------------------------------------------------------------   ----------------------------------------------------------------------------------------------------------------------------------------------  Time spent: > than  55  Min.   SIGNED: 04/18/2020, MD, FHM.  [facility],  Pager (Please use [email or web address] to page to text)  If 7PM-7AM, please contact night-coverage [email or web address],  08/31/2020, 1:04 PM

## 2020-09-01 LAB — CBC WITH DIFFERENTIAL/PLATELET
Abs Immature Granulocytes: 0.1 10*3/uL — ABNORMAL HIGH (ref 0.00–0.07)
Basophils Absolute: 0 10*3/uL (ref 0.0–0.1)
Basophils Relative: 0 %
Eosinophils Absolute: 0 10*3/uL (ref 0.0–0.5)
Eosinophils Relative: 0 %
HCT: 43.1 % (ref 36.0–46.0)
Hemoglobin: 14.2 g/dL (ref 12.0–15.0)
Immature Granulocytes: 2 %
Lymphocytes Relative: 12 %
Lymphs Abs: 0.8 10*3/uL (ref 0.7–4.0)
MCH: 28.5 pg (ref 26.0–34.0)
MCHC: 32.9 g/dL (ref 30.0–36.0)
MCV: 86.5 fL (ref 80.0–100.0)
Monocytes Absolute: 0.5 10*3/uL (ref 0.1–1.0)
Monocytes Relative: 7 %
Neutro Abs: 5.4 10*3/uL (ref 1.7–7.7)
Neutrophils Relative %: 79 %
Platelets: 245 10*3/uL (ref 150–400)
RBC: 4.98 MIL/uL (ref 3.87–5.11)
RDW: 13.8 % (ref 11.5–15.5)
WBC: 6.8 10*3/uL (ref 4.0–10.5)
nRBC: 0 % (ref 0.0–0.2)

## 2020-09-01 LAB — COMPREHENSIVE METABOLIC PANEL
ALT: 36 U/L (ref 0–44)
AST: 28 U/L (ref 15–41)
Albumin: 2.6 g/dL — ABNORMAL LOW (ref 3.5–5.0)
Alkaline Phosphatase: 93 U/L (ref 38–126)
Anion gap: 7 (ref 5–15)
BUN: 21 mg/dL (ref 8–23)
CO2: 27 mmol/L (ref 22–32)
Calcium: 8.6 mg/dL — ABNORMAL LOW (ref 8.9–10.3)
Chloride: 103 mmol/L (ref 98–111)
Creatinine, Ser: 0.47 mg/dL (ref 0.44–1.00)
GFR, Estimated: >60 mL/min (ref >60)
Glucose, Bld: 110 mg/dL — ABNORMAL HIGH (ref 70–99)
Potassium: 3.6 mmol/L (ref 3.5–5.1)
Sodium: 137 mmol/L (ref 135–145)
Total Bilirubin: 0.9 mg/dL (ref 0.3–1.2)
Total Protein: 5.7 g/dL — ABNORMAL LOW (ref 6.5–8.1)

## 2020-09-01 LAB — FERRITIN: Ferritin: 217 ng/mL (ref 11–307)

## 2020-09-01 LAB — D-DIMER, QUANTITATIVE: D-Dimer, Quant: 0.9 ug/mL-FEU — ABNORMAL HIGH (ref 0.00–0.50)

## 2020-09-01 LAB — C-REACTIVE PROTEIN: CRP: 4 mg/dL — ABNORMAL HIGH (ref <1.0)

## 2020-09-01 MED ORDER — METHYLPREDNISOLONE SODIUM SUCC 40 MG IJ SOLR
40.0000 mg | INTRAMUSCULAR | Status: DC
  Administered 2020-09-01: 40 mg via INTRAVENOUS
  Filled 2020-09-01: qty 1

## 2020-09-01 NOTE — Progress Notes (Signed)
Progress note  Patient: Destiny Lopez                            PCP: Eartha InchBadger, Michael C, MD                    DOB: 12/22/1950            DOA: 08/28/2020 [id number]             DOS: 09/01/2020, 11:37 AM  Eartha InchBadger, Michael C, MD  Patient coming from:   HOME  I have personally reviewed patient's medical records, in electronic medical records, including:  [facility], and care everywhere.    CC: Altered mental status, shortness of breath, COVID-positive  Subjective   Patient was seen and examined this morning, laying in bed comfortable, awake not much interactive. Per nursing staff no issues overnight. Per nursing staff with lots of encouragement and assist does take p.o.    Hospital course:     Destiny Lopez is a 70 y.o. female with medical history significant of advanced Alzheimer's dementia, noncommunicating, hypertension, hyperlipidemia, GERD, depression with dementia, thrombocytopenia, osteopenia... Scented from home via EMS, for hypoxia, worsening mental status recent SARS-CoV-2 positive.  Called patient's POA husband Mr. Arlean Hoppingatrick Rackham who confirmed the patient has advanced dementia nonverbal starting this year, her mental status progressively got worse with noted hypoxia O2 sat ranging 86 to 90% at home.  He confirms that the household ,husband, son and the patient is vaccinated with 1 boost.   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)   Thrombocytopenia (HCC)   SARS-CoV-2 virus infection with hypoxia/pneumonia -Stable, no respiratory distress, improved hypoxia -Has been weaned off 2 L of oxygen-satting 97% on room air   -According to the husband at home satting 86 to 90%, not O2 dependent -WBC 7.9, hemoglobin 15.4, platelets 201, potassium 3.3,  BUN 41, glucose 129, lactic acid 2.0, procalcitonin 0.68, LDL 227, ferritin 423, CRP three 3.8, D-dimer 1.8, fibrinogen> 800, - SARS-CoV-2 positive, X-ray revealing right lung infiltrate, consistent with pneumonia, COPD  -Initiating remdesivir,   -We will continue with IV steroids with quick taper, and IV remdesivir 4/5 of treatment -Empiric antibiotics Rocephin, azithromycin..  Day 4/5 of treatment   Dysphagia  -Exacerbated by poor cognition, advancing dementia -Status post detailed evaluation by speech, recommending dysphagia 2 diet With aspiration precaution -Continue gentle IV fluid hydration -Dietitian consulted appreciate recommendations -Encouraging patient to eat with assist of nursing staff Refusing to take her meds or p.o. diet at this     Active Problems:    Essential hypertension -stable, without medication    Pure hypercholesterolemia -do not recommend statins due to muscle wasting, risks outweighs the benefit    Anxiety and depression -as needed Xanax, stable,    Alzheimer disease (HCC)  -Worsening cognition and behavior refusing p.o. 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 Consulting dietitian for nutrition supplements -Continue dietary supplements -  Encourage increased p.o. intake with dietary supplements-at this point refusing Patient needs a lot of stimulants, encouragement to take anything p.o.,  Ethics: Discussed with her husband, current findings, plan of care and admission.  Also discussed CODE STATUS confirmed DNR/DNI status.  Also confirmed nonverbal at baseline, continue to decline. He expressed understanding and  agreed to current plan. Due to multiple comorbidities, advanced dementia prognosis remain poor Palliative care consulted to determine goals of care.... Likely would benefit from hospice at home  Cultures:  -Blood and sputum Cultures >> Antimicrobial: -Romycin, Rocephin  Consults called:  None -------------------------------------------------------------------------------------------------------------------------------------------- DVT prophylaxis: SCD/Compression stockings and Heparin SQ Code Status:   Code Status: DNR -confirmed by her husband   Admission status: Patient will be admitted as Inpatient, with a greater than 2 midnight length of stay. Level of care: Telemetry   Family Communication:  Spuse Mr. Tomma Ehinger  (The above findings and plan of care has been discussed with patient in detail, the patient expressed understanding and agreement of above plan)  -------------------------------------------------------------------------------------------------------------------------------------------------  Disposition Plan: >3 days Status is: Inpatient  Remains inpatient appropriate because:Hemodynamically unstable, IV treatments appropriate due to intensity of illness or inability to take PO, and Inpatient level of care appropriate due to severity of illness  Dispo: The patient is from: Home              Anticipated d/c is to: Home              Patient currently is not medically stable to d/c.   Difficult to place patient No     ===================================================================================  Allergies  Allergen Reactions   Dexlansoprazole Other (See Comments)   Sodium Pentobarbital [Pentobarbital Sodium] Nausea And Vomiting    vitamin C  500 mg Oral Daily   chlorpheniramine-HYDROcodone  5 mL Oral Q12H   donepezil  10 mg Oral QHS   feeding supplement  237 mL Oral TID BM   guaiFENesin-dextromethorphan  10 mL Oral Q8H   heparin  5,000 Units  Subcutaneous Q8H   mouth rinse  15 mL Mouth Rinse q12n4p   meloxicam  7.5 mg Oral Daily   memantine  28 mg Oral Daily   methylPREDNISolone (SOLU-MEDROL) injection  40 mg Intravenous Q12H   mirtazapine  45 mg Oral QHS   zinc sulfate  220 mg Oral Daily      PRN MEDs: acetaminophen, albuterol, ALPRAZolam, haloperidol lactate, Ipratropium-Albuterol, ondansetron **OR** ondansetron (ZOFRAN) IV, oxyCODONE, polyethylene glycol, traZODone  Past Medical History:  Diagnosis Date   Alzheimer disease (HCC) 01/18/2018   Chest pain, unspecified    Dysthymic disorder    Irritable bowel syndrome    Memory disorder 04/10/2013   Memory loss    Other malaise and fatigue    Pure hypercholesterolemia    Shortness of breath    Unspecified essential hypertension     Past Surgical History:  Procedure Laterality Date   APPENDECTOMY     CHOLECYSTECTOMY     INTRAMEDULLARY (IM) NAIL INTERTROCHANTERIC Left 02/19/2020   Procedure: Operative fixation left hip;  Surgeon: Oliver Barre, MD;  Location: [facility];  Service: Orthopedics;  Laterality: Left;   TONSILLECTOMY       reports that she has never smoked. She has never used smokeless tobacco. She reports that she does not drink alcohol and does not use drugs.   Family History  Problem Relation Age of Onset   Coronary artery disease Father 39       Died MI age 12   Alzheimer's  disease Mother        Pacemaker   Coronary artery disease Brother 81       Heart attacks in his 69s    Physical Exam:   Vitals:   08/31/20 0505 08/31/20 1431 08/31/20 1957 09/01/20 0553  BP: 111/61 129/68 (!) 131/92 (!) 164/60  Pulse: 63 62 76 60  Resp: 18 18 19 18   Temp: 98.1 F (36.7 C) 97.8 F (36.6 C) 97.9 F (36.6 C) 97.7 F (36.5 C)  TempSrc:    Oral  SpO2: 95% 100% 100% 97%  Weight:             Physical Exam:   General:  Awake, eyes open, minimal verbal communication, noninteractive Due to advanced dementia  HEENT:  Normocephalic, PERRL, otherwise  with in Normal limits   Neuro:  Limited exam due to advanced dementia CNII-XII intact. , normal motor and sensation, reflexes intact   Lungs:   Clear to auscultation BL, Respirations unlabored, no wheezes / crackles  Cardio:    S1/S2, RRR, No murmure, No Rubs or Gallops   Abdomen:   Soft, non-tender, bowel sounds active all four quadrants,  no guarding or peritoneal signs.  Muscular skeletal:  Global generalized weaknesses with cachexia  Otherwise Limited exam - in bed, able to move all 4 extremities,  2+ pulses,  symmetric, No pitting edema  Skin:  Dry, warm to touch, negative for any Rashes,  Wounds: Please see nursing documentation                 Labs on admission:    I have personally reviewed following labs and imaging studies  CBC: Recent Labs  Lab 08/28/20 1141 08/29/20 0625 08/30/20 0521 08/31/20 0721 09/01/20 0638  WBC 7.9 8.5 7.9 6.3 6.8  NEUTROABS 7.1 7.7 7.0 5.1 5.4  HGB 15.4* 14.6 14.1 14.3 14.2  HCT 47.3* 45.3 44.1 44.9 43.1  MCV 89.6 89.3 88.7 91.4 86.5  PLT 201 226 223 236 245   Basic Metabolic Panel: Recent Labs  Lab 08/28/20 1141 08/29/20 0625 08/30/20 0521 08/31/20 0721 09/01/20 0638  NA 144 146* 146* 140 137  K 3.3* 3.8 3.4* 5.4* 3.6  CL 107 112* 109 109 103  CO2 25 23 29 27 27   GLUCOSE 129* 177* 187* 173* 110*  BUN 41* 50* 46* 41* 21  CREATININE 0.87 0.78 0.67 0.65 0.47  CALCIUM 8.9 8.9 9.4 9.3 8.6*   GFR: Estimated Creatinine Clearance: 44.3 mL/min (by C-G formula based on SCr of 0.47 mg/dL). Liver Function Tests: Lipid Profile: No results for input(s): CHOL, HDL, LDLCALC, TRIG, CHOLHDL, LDLDIRECT in the last 72 hours.  Thyroid Function Tests: No results for input(s): TSH, T4TOTAL, FREET4, T3FREE, THYROIDAB in the last 72 hours. Anemia Panel: Recent Labs    08/31/20 0721 09/01/20 0638  FERRITIN 344* 217   Urine analysis:    Component Value Date/Time   COLORURINE YELLOW 02/19/2020 0142   APPEARANCEUR CLOUDY (A)  02/19/2020 0142   LABSPEC 1.028 02/19/2020 0142   PHURINE 5.0 02/19/2020 0142   GLUCOSEU NEGATIVE 02/19/2020 0142   HGBUR MODERATE (A) 02/19/2020 0142   BILIRUBINUR NEGATIVE 02/19/2020 0142   KETONESUR 5 (A) 02/19/2020 0142   PROTEINUR 30 (A) 02/19/2020 0142   NITRITE POSITIVE (A) 02/19/2020 0142   LEUKOCYTESUR NEGATIVE 02/19/2020 0142     Radiologic Exams on Admission:   No results found.  EKG:   Independently reviewed.  Orders placed or performed during the hospital encounter of 08/28/20   EKG  12-Lead   EKG 12-Lead   ED EKG 12-Lead   ED EKG 12-Lead   EKG   ---------------------------------------------------------------------------------------------------------------------------------------   ----------------------------------------------------------------------------------------------------------------------------------------------  Time spent: > than  55  Min.   SIGNED: Kendell Bane, MD, FHM. [facility],  Pager (Please use [email or web address] to page to text)  If 7PM-7AM, please contact night-coverage [email or web address],  09/01/2020, 11:37 AM

## 2020-09-01 NOTE — Progress Notes (Signed)
Pt rested well through out the night with no s/s of pain or discomfort. ADL' completed. Pt tolerated well. Bed alarm on. Bed placed in lowest position with the wheels locked. Will continue to monitor.

## 2020-09-02 LAB — CBC WITH DIFFERENTIAL/PLATELET
Abs Immature Granulocytes: 0.19 10*3/uL — ABNORMAL HIGH (ref 0.00–0.07)
Basophils Absolute: 0 10*3/uL (ref 0.0–0.1)
Basophils Relative: 0 %
Eosinophils Absolute: 0 10*3/uL (ref 0.0–0.5)
Eosinophils Relative: 0 %
HCT: 39.9 % (ref 36.0–46.0)
Hemoglobin: 13 g/dL (ref 12.0–15.0)
Immature Granulocytes: 3 %
Lymphocytes Relative: 15 %
Lymphs Abs: 0.8 10*3/uL (ref 0.7–4.0)
MCH: 28.5 pg (ref 26.0–34.0)
MCHC: 32.6 g/dL (ref 30.0–36.0)
MCV: 87.5 fL (ref 80.0–100.0)
Monocytes Absolute: 0.4 10*3/uL (ref 0.1–1.0)
Monocytes Relative: 6 %
Neutro Abs: 4.3 10*3/uL (ref 1.7–7.7)
Neutrophils Relative %: 76 %
Platelets: 217 10*3/uL (ref 150–400)
RBC: 4.56 MIL/uL (ref 3.87–5.11)
RDW: 14.1 % (ref 11.5–15.5)
WBC: 5.7 10*3/uL (ref 4.0–10.5)
nRBC: 0 % (ref 0.0–0.2)

## 2020-09-02 LAB — COMPREHENSIVE METABOLIC PANEL
ALT: 29 U/L (ref 0–44)
AST: 19 U/L (ref 15–41)
Albumin: 2.3 g/dL — ABNORMAL LOW (ref 3.5–5.0)
Alkaline Phosphatase: 83 U/L (ref 38–126)
Anion gap: 7 (ref 5–15)
BUN: 19 mg/dL (ref 8–23)
CO2: 27 mmol/L (ref 22–32)
Calcium: 8.7 mg/dL — ABNORMAL LOW (ref 8.9–10.3)
Chloride: 104 mmol/L (ref 98–111)
Creatinine, Ser: 0.47 mg/dL (ref 0.44–1.00)
GFR, Estimated: >60 mL/min (ref >60)
Glucose, Bld: 96 mg/dL (ref 70–99)
Potassium: 3.5 mmol/L (ref 3.5–5.1)
Sodium: 138 mmol/L (ref 135–145)
Total Bilirubin: 0.7 mg/dL (ref 0.3–1.2)
Total Protein: 4.9 g/dL — ABNORMAL LOW (ref 6.5–8.1)

## 2020-09-02 LAB — C-REACTIVE PROTEIN: CRP: 8.1 mg/dL — ABNORMAL HIGH (ref <1.0)

## 2020-09-02 LAB — CULTURE, BLOOD (ROUTINE X 2)
Culture: NO GROWTH
Culture: NO GROWTH

## 2020-09-02 LAB — FERRITIN: Ferritin: 154 ng/mL (ref 11–307)

## 2020-09-02 LAB — D-DIMER, QUANTITATIVE: D-Dimer, Quant: 0.64 ug/mL-FEU — ABNORMAL HIGH (ref 0.00–0.50)

## 2020-09-02 MED ORDER — PREDNISONE 20 MG PO TABS
40.0000 mg | ORAL_TABLET | Freq: Every day | ORAL | Status: DC
  Administered 2020-09-02: 40 mg via ORAL
  Filled 2020-09-02 (×2): qty 2

## 2020-09-02 MED ORDER — MEGESTROL ACETATE 400 MG/10ML PO SUSP
400.0000 mg | Freq: Two times a day (BID) | ORAL | Status: DC
  Administered 2020-09-02 – 2020-09-03 (×2): 400 mg via ORAL
  Filled 2020-09-02 (×4): qty 10

## 2020-09-02 NOTE — Progress Notes (Signed)
Palliative: Destiny Lopez is lying quietly in bed.  She appears acutely/chronically ill and quite frail.  She has known advanced dementia, and is unable to make her basic needs known.  She is having poor by mouth intake.  There is no family at bedside at this time due to COVID visitor restrictions.  Call to husband, Destiny Lopez.  Destiny Lopez shares that he has spoken with the hospitalist, and understands that Destiny Lopez is nearing end-of-life.  He tells me that she was first diagnosed with memory loss in 2004, and he had to quit work to care for her full-time around 2016.  Destiny Lopez i tells me about his wife's declines, in particular now that she has been diagnosed with COVID.  We discuss hospice care.  At this point, Destiny Lopez states that he would like for Destiny Lopez to return to their home with the benefits of hospice.  Provider choice offered.  [facility] is provider of choice.  Destiny Lopez states that they have lived in [place] for 50 years now.  We talked about what is and is not provided with at home hospice.  We talked about transitioning to residential hospice when time is appropriate.  We talk about how hospice can support them. No need for a hospital bed at this time.   Conference with attending, bedside nursing staff, transition of care team related to patient condition, needs, goals of care, disposition.  Plan: Home with the benefits of hospice of [facility].  At this point, continue to treat the treatable but no CPR or intubation.  Transition to residential hospice when appropriate, do not rehospitalize. Prognosis: 2 months or less would not be surprising based on advanced dementia, decreased functional status, poor by mouth intake, frailty.  35 minutes  Destiny Carmel, NP Palliative medicine team Team phone [phone] Greater than 50% of this time was spent counseling and coordinating care related to the above assessment and plan.

## 2020-09-02 NOTE — Progress Notes (Signed)
  Speech Language Pathology Treatment: Dysphagia  Patient Details Name: Destiny Lopez MRN: [id number] DOB: 1950/06/17 Today's Date: 09/02/2020 Time: 7322-0254 SLP Time Calculation (min) (ACUTE ONLY): 29 min  Assessment / Plan / Recommendation Clinical Impression  Pt seen for ongoing dysphagia intervention. She was sitting alert and upright in her bed upon SLP arrival. Oral care provided. Pt continues to present with cognitive based dysphagia with constant SLP cueing for po readiness. She eventually consumed a 5.3 ounce container of greek strawberry yogurt and sips of ice tea via straw sips with careful and patient feeding. Pt without overt coughing or signs of reduced airway protection. Acknowledge that plan is for d/c with hospice. Recommend continuing diet as ordered, D1/puree and thin liquids with careful feeding and oral care, po medications crushed in puree as able. Intake will likely continue to be limited due to cognitive deficits. SLP will sign off. Reconsult if needs arise.    HPI HPI: 70 y.o. female  with past medical history of advanced Alzheimer's dementia, noncommunicating, hypertension, hyperlipidemia, GERD, depression with dementia, thrombocytopenia, osteopenia admitted on 08/28/2020 with COVID-19+, pneumonia, hypoxia.  Per the patient's husband her home O2 sat ranges 86 to 90%.  The patient and her husband were both positive for COVID-19, as well as their son who tested positive several days before them.  Her husband states that both he and she slept for most 48 hours.  He woke up yesterday, and the patient was not very arousable.  At that point he called EMS because her O2 sat was 84%.  She improved 94% on nasal cannula oxygen.  X-ray completed in the ED found right lung infiltrate consistent with pneumonia as well as COPD changes. The patient is end-stage Alzheimer's and 2 or 3 months ago she worsened from answering yes/no at times to being essentially nonverbal.  She is not aware of  her current situation, environment.  She is being admitted for treatment of COVID-pneumonia with plans to discharge back home. BSE requested.      SLP Plan  All goals met;Discharge SLP treatment due to (comment)       Recommendations  Diet recommendations: Dysphagia 1 (puree);Thin liquid Liquids provided via: Cup;Straw Medication Administration: Crushed with puree Supervision: Full supervision/cueing for compensatory strategies Compensations: Slow rate;Small sips/bites;Lingual sweep for clearance of pocketing Postural Changes and/or Swallow Maneuvers: Seated upright 90 degrees;Upright 30-60 min after meal                Oral Care Recommendations: Oral care before and after PO;Staff/trained caregiver to provide oral care Follow up Recommendations: 24 hour supervision/assistance SLP Visit Diagnosis: Dysphagia, unspecified (R13.10) Plan: All goals met;Discharge SLP treatment due to (comment)       Thank you,  Genene Churn, [place]                 [place] 09/02/2020, 4:30 PM

## 2020-09-02 NOTE — Clinical Social Work Note (Signed)
Per palliative care NP, patient referred to [facility] for hospice services in her home.    Destiny Lopez, Destiny China, LCSW

## 2020-09-02 NOTE — Progress Notes (Signed)
This nurse went in to assist pt with breakfast. Pt refused breakfast. This nurse encouraged pt to eat, all attempts were unsuccessful.

## 2020-09-02 NOTE — Progress Notes (Signed)
Progress note  Patient: Destiny Lopez                            PCP: Destiny Inch, MD                    DOB: July 19, 1950            DOA: 08/28/2020 YDX:[id number]             DOS: 09/02/2020, 12:02 PM  Destiny Inch, MD  Patient coming from:   HOME  I have personally reviewed patient's medical records, in electronic medical records, including:  [facility], and care everywhere.    CC: Altered mental status, shortness of breath, COVID-positive  Subjective   The patient was seen and examined this morning, somnolent sleepy, speech unclear, no verbal communication Seems to be comfortable in bed Per nursing she has been refusing her breakfast, she has refused oral intake  Was hemodynamically stable, satting 100% on room air   Hospital course:     Destiny Lopez is a 70 y.o. female with medical history significant of advanced Alzheimer's dementia, noncommunicating, hypertension, hyperlipidemia, GERD, depression with dementia, thrombocytopenia, osteopenia... Scented from home via EMS, for hypoxia, worsening mental status recent SARS-CoV-2 positive.  Called patient's POA husband Destiny Lopez who confirmed the patient has advanced dementia nonverbal starting this year, her mental status progressively got worse with noted hypoxia O2 sat ranging 86 to 90% at home.  He confirms that the household ,husband, son and the patient is vaccinated with 1 boost.   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)   Thrombocytopenia (HCC)   SARS-CoV-2 virus infection with hypoxia/pneumonia -Stable, much improved, currently satting 100% on 2 L, will wean off oxygen -We will assess on room air -Completed 5 days treatment of rRemdesivir    -According to the husband at  home satting 86 to 90%, not O2 dependent -WBC 7.9, hemoglobin 15.4, platelets 201, potassium 3.3, BUN 41, glucose 129, lactic acid 2.0, procalcitonin 0.68, LDL 227, ferritin 423, CRP three 3.8, D-dimer 1.8, fibrinogen> 800, - SARS-CoV-2 positive, X-ray revealing right lung infiltrate, consistent with pneumonia, COPD  -Initiating IV remdesivir treatment 09/02/2020 completed  -Tapering down steroids -Empiric antibiotics Rocephin, azithromycin..  Day 5/5 of treatment..  Discontinued   Dysphagia  -Exacerbated by poor cognition, advancing dementia -Continues to refuse to eat, will continue to encourage oral intake -Status post detailed evaluation by speech, recommending dysphagia 2 diet With aspiration precaution -Continue gentle IV fluid hydration -Dietitian consulted appreciate recommendations -Encouraging patient to eat with assist of nursing staff -Patient is not a candidate for PEG tube, as it would not prolong or change her prognosis   Active Problems:    Essential hypertension -stable, without medication    Pure hypercholesterolemia -do not recommend statins due to muscle wasting, risks outweighs the benefit    Anxiety and depression -as needed Xanax, stable,    Alzheimer disease (HCC)  -Worsening cognition and behavior refusing p.o. intake -Continue Namenda -Per husband progressively worsening over past year, nonverbal at baseline    GERD without esophagitis -continue PPI    Depression due to dementia [facility]) -continue home medication of Remeron    Thrombocytopenia (HCC) -monitoring  stable  Severe malnutrition related to chronic illness (progressive dementia) Cachexia,  Body mass index is 14.78 kg/m. Consulting dietitian for nutrition supplements -Continue dietary supplements -Encourage increased p.o. intake with dietary supplements-at this point refusing Patient needs a lot of stimulants, encouragement to take anything p.o.,  Ethics: Discussed with her husband,  current findings, plan of care and admission.  Also discussed CODE STATUS confirmed DNR/DNI status.  Also confirmed nonverbal at baseline, continue to decline. He expressed understanding and agreed to current plan. Due to multiple comorbidities, advanced dementia prognosis remain poor Palliative care consulted to determine goals of care.... Likely would benefit from hospice at home  Cultures:  -Blood and sputum Cultures >> Antimicrobial: -Romycin, Rocephin  Consults called:  None -------------------------------------------------------------------------------------------------------------------------------------------- DVT prophylaxis: SCD/Compression stockings and Heparin SQ Code Status:   Code Status: DNR -confirmed by her husband   Admission status: Patient will be admitted as Inpatient, with a greater than 2 midnight length of stay. Level of care: Telemetry   Family Communication:  Spuse Destiny Lopez  (The above findings and plan of care has been discussed with patient in detail, the patient expressed understanding and agreement of above plan)  -------------------------------------------------------------------------------------------------------------------------------------------------  Disposition Plan: >3 days Status is: Inpatient  Remains inpatient appropriate because:Hemodynamically unstable, IV treatments appropriate due to intensity of illness or inability to take PO, and Inpatient level of care appropriate due to severity of illness  Dispo: The patient is from: Home              Anticipated d/c is to: [place] with home hospice in the next 1 to 2 days              Patient currently is not medically stable to d/c.   Difficult to place patient No     ===================================================================================  Allergies  Allergen Reactions   Dexlansoprazole Other (See Comments)   Sodium Pentobarbital [Pentobarbital Sodium] Nausea  And Vomiting    vitamin C  500 mg Oral Daily   chlorpheniramine-HYDROcodone  5 mL Oral Q12H   donepezil  10 mg Oral QHS   feeding supplement  237 mL Oral TID BM   guaiFENesin-dextromethorphan  10 mL Oral Q8H   heparin  5,000 Units Subcutaneous Q8H   mouth rinse  15 mL Mouth Rinse q12n4p   meloxicam  7.5 mg Oral Daily   memantine  28 mg Oral Daily   mirtazapine  45 mg Oral QHS   predniSONE  40 mg Oral Q breakfast   zinc sulfate  220 mg Oral Daily      PRN MEDs: acetaminophen, albuterol, ALPRAZolam, haloperidol lactate, Ipratropium-Albuterol, ondansetron **OR** ondansetron (ZOFRAN) IV, oxyCODONE, polyethylene glycol, traZODone  Past Medical History:  Diagnosis Date   Alzheimer disease (HCC) 01/18/2018   Chest pain, unspecified    Dysthymic disorder    Irritable bowel syndrome    Memory disorder 04/10/2013   Memory loss    Other malaise and fatigue    Pure hypercholesterolemia    Shortness of breath    Unspecified essential hypertension     Past Surgical History:  Procedure Laterality Date   APPENDECTOMY     CHOLECYSTECTOMY     INTRAMEDULLARY (IM) NAIL INTERTROCHANTERIC Left 02/19/2020   Procedure: Operative fixation left hip;  Surgeon: Oliver Barre, MD;  Location: [facility];  Service: Orthopedics;  Laterality: Left;   TONSILLECTOMY       reports that she has never smoked. She has never used smokeless tobacco. She reports that she does not  drink alcohol and does not use drugs.   Family History  Problem Relation Age of Onset   Coronary artery disease Father [id number]       Died MI age 72   Alzheimer's disease Mother        Pacemaker   Coronary artery disease Brother [id number]       Heart attacks in his 5540s    Physical Exam:   Vitals:   09/01/20 0553 09/01/20 1354 09/01/20 2107 09/02/20 0617  BP: (!) 164/60 137/79 (!) 133/55 (!) 148/75  Pulse: 60 60 70 (!) 58  Resp: 18 15 18 18   Temp: 97.7 F (36.5 C) 97.6 F (36.4 C) 98.1 F (36.7 C) 98.1 F (36.7 C)  TempSrc: Oral   Oral   SpO2: 97% 97% 99% 100%  Weight:            Physical Exam:   General:  Sleeping, was awakened, nonverbal, remained confused with advanced dementia -cachexia,  HEENT:  Normocephalic, PERRL, otherwise with in Normal limits   Neuro:  Limited exam-CNII-XII intact. , normal motor and sensation, reflexes intact   Lungs:   Clear to auscultation BL, Respirations unlabored, no wheezes / crackles  Cardio:    S1/S2, RRR, No murmure, No Rubs or Gallops   Abdomen:   Soft, non-tender, bowel sounds active all four quadrants,  no guarding   Muscular skeletal:  Limited exam -severe generalized muscle wasting-in bed, able to move all 4 extremities, 2+ pulses,  symmetric, No pitting edema  Skin:  Dry, warm to touch, negative for any Rashes,  Wounds: Please see nursing documentation          Labs on admission:    I have personally reviewed following labs and imaging studies  CBC: Recent Labs  Lab 08/29/20 0625 08/30/20 0521 08/31/20 0721 09/01/20 0638 09/02/20 0549  WBC 8.5 7.9 6.3 6.8 5.7  NEUTROABS 7.7 7.0 5.1 5.4 4.3  HGB 14.6 14.1 14.3 14.2 13.0  HCT 45.3 44.1 44.9 43.1 39.9  MCV 89.3 88.7 91.4 86.5 87.5  PLT 226 223 236 245 217   Basic Metabolic Panel: Recent Labs  Lab 08/29/20 0625 08/30/20 0521 08/31/20 0721 09/01/20 0638 09/02/20 0549  NA 146* 146* 140 137 138  K 3.8 3.4* 5.4* 3.6 3.5  CL 112* 109 109 103 104  CO2 23 29 27 27 27   GLUCOSE 177* 187* 173* 110* 96  BUN 50* 46* 41* 21 19  CREATININE 0.78 0.67 0.65 0.47 0.47  CALCIUM 8.9 9.4 9.3 8.6* 8.7*   GFR: Estimated Creatinine Clearance: 44.3 mL/min (by C-G formula based on SCr of 0.47 mg/dL). Liver Function Tests: Lipid Profile: No results for input(s): CHOL, HDL, LDLCALC, TRIG, CHOLHDL, LDLDIRECT in the last 72 hours.  Thyroid Function Tests: No results for input(s): TSH, T4TOTAL, FREET4, T3FREE, THYROIDAB in the last 72 hours. Anemia Panel: Recent Labs    09/01/20 0638 09/02/20 0549  FERRITIN 217 154    Urine analysis:    Component Value Date/Time   COLORURINE YELLOW 02/19/2020 0142   APPEARANCEUR CLOUDY (A) 02/19/2020 0142   LABSPEC 1.028 02/19/2020 0142   PHURINE 5.0 02/19/2020 0142   GLUCOSEU NEGATIVE 02/19/2020 0142   HGBUR MODERATE (A) 02/19/2020 0142   BILIRUBINUR NEGATIVE 02/19/2020 0142   KETONESUR 5 (A) 02/19/2020 0142   PROTEINUR 30 (A) 02/19/2020 0142   NITRITE POSITIVE (A) 02/19/2020 0142   LEUKOCYTESUR NEGATIVE 02/19/2020 0142     Radiologic Exams on Admission:   No results found.  EKG:  Independently reviewed.  Orders placed or performed during the hospital encounter of 08/28/20   EKG 12-Lead   EKG 12-Lead   ED EKG 12-Lead   ED EKG 12-Lead   EKG   ---------------------------------------------------------------------------------------------------------------------------------------   ----------------------------------------------------------------------------------------------------------------------------------------------  Time spent: > than  55  Min.   SIGNED: Kendell Bane, MD, FHM. [facility],  Pager (Please use [email or web address] to page to text)  If 7PM-7AM, please contact night-coverage [email or web address],  09/02/2020, 12:02 PM

## 2020-09-03 ENCOUNTER — Other Ambulatory Visit: Payer: Self-pay | Admitting: Neurology

## 2020-09-03 MED ORDER — METHYLPREDNISOLONE 4 MG PO TBPK: ORAL_TABLET | ORAL | 0 refills | Status: AC

## 2020-09-03 MED ORDER — MEGESTROL ACETATE 400 MG/10ML PO SUSP: 400.0000 mg | Freq: Two times a day (BID) | ORAL | 0 refills | Status: AC

## 2020-09-03 NOTE — Clinical Social Work Note (Signed)
Casandra at [facility] confirms receipt of referral. Advised that patient may need equipment.   Araseli Sherry, Juleen China, LCSW

## 2020-09-03 NOTE — Discharge Summary (Signed)
Physician Discharge Summary [facility]    Patient: Destiny Lopez                   Admit date: 08/28/2020   DOB: 18-Mar-1950             Discharge date:09/03/2020/8:22 AM [name]                          PCP: Destiny Inch, MD  Disposition: HOME with Home Health   Recommendations for Outpatient Follow-up:   Follow up: With PCP in 2-3 weeks Follow-up with palliative care, pursue hospice at home Continue currently recommended medication  Discharge Condition: Stable   Code Status:   Code Status: DNR  Diet recommendation: Regular diet:  Diet recommendations: Dysphagia 1 (puree);Thin liquid Liquids provided via: Cup;Straw Medication Administration: Crushed with puree Supervision: Full supervision/cueing for compensatory strategies Compensations: Slow rate;Small sips/bites;Lingual sweep for clearance of pocketing Postural Changes and/or Swallow Maneuvers: Seated upright 90 degrees;Upright 30-60 min after meal          Discharge Diagnoses:    Principal Problem:   COVID-19 virus infection Active Problems:   Essential hypertension   Pure hypercholesterolemia   Anxiety and depression   Alzheimer disease (HCC)   GERD without esophagitis   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 female with medical history significant of advanced Alzheimer's dementia, noncommunicating, hypertension, hyperlipidemia, GERD, depression with dementia, thrombocytopenia, osteopenia... Scented from home via EMS, for hypoxia, worsening mental status recent SARS-CoV-2 positive.   Called patient's POA husband Destiny Lopez who confirmed the patient has advanced dementia nonverbal starting this year, her mental status progressively got worse with noted hypoxia O2 sat ranging 86 to 90% at home.   He confirms that the household ,husband, son and the patient is vaccinated  with 1 boost.   SARS-CoV-2 virus infection with hypoxia/pneumonia -Currently hemodynamically stable, satting 100%      -According to the husband at home satting 86 to 90%, not O2 dependent -WBC 7.9, hemoglobin 15.4, platelets 201, potassium 3.3, BUN 41, glucose 129, lactic acid 2.0, procalcitonin 0.68, LDL 227, ferritin 423, CRP three 3.8, D-dimer 1.8, fibrinogen> 800, - SARS-CoV-2 positive, X-ray revealing right lung infiltrate, consistent with pneumonia, COPD  -Initiating remdesivir,    -We will continue with IV steroids with quick taper, and IV remdesivir 4/5 of treatment -Empiric antibiotics Rocephin, azithromycin..  Day 4/5 of treatment     Dysphagia  -Exacerbated by poor cognition, advancing dementia -Status post detailed evaluation by speech, recommending dysphagia 2 diet With aspiration precaution -Continue gentle IV fluid hydration -Dietitian consulted appreciate recommendations -Encouraging patient to eat with assist of nursing staff Refusing to take her meds or p.o. diet at this       Active Problems:     Essential hypertension -stable, without medication    Pure hypercholesterolemia -do not recommend statins due to muscle wasting, risks outweighs the benefit     Anxiety and depression -as needed Xanax, stable,     Alzheimer disease (HCC)  -Worsening cognition and behavior refusing p.o. 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 Consulting dietitian for nutrition supplements -Continue dietary supplements -Encourage increased p.o. intake with dietary supplements-at this point refusing Patient needs a lot of stimulants,  encouragement to take anything p.o.,   Ethics: Discussed with her husband, current findings, plan of care and admission.  Also discussed CODE STATUS confirmed DNR/DNI status.  Also confirmed nonverbal at baseline, continue to decline. He expressed understanding and agreed to current plan. Due to multiple comorbidities, advanced dementia prognosis remain poor Palliative care consulted to determine goals of care.... Likely would benefit from hospice at home   Cultures:  -Blood and sputum Cultures >> no growth to date Antimicrobial: -Romycin, Rocephin -pleated 5-day course of treatment    Code Status:   Code Status: DNR -confirmed by her husband        Family Communication:  Spuse Destiny Lopez  (The above findings and plan of care has been discussed with patient in detail, the patient expressed understanding and agreement of above plan)  ----------------------------------------------------------------------------------------------------------------------------------   Disposition Plan:  Dispo: The patient is from: Home              Anticipated d/c is to: Home with home health, follow-up palliative care pursuing hospice    Discharge Instructions:   Discharge Instructions     Activity as tolerated - No restrictions   Complete by: As directed    Diet - low sodium heart healthy   Complete by: As directed    Discharge instructions   Complete by: As directed    Follow with a PCP in 2-3 weeks, recommending home health social worker, palliative care, pursuing hospice   Increase activity slowly   Complete by: As directed         Medication List     STOP taking these medications    cetirizine 10 MG tablet Commonly known as: ZYRTEC   CITRACAL PO   famotidine 20 MG tablet Commonly known as: PEPCID   potassium chloride 20 MEQ/15ML (10%) Soln   pravastatin 20 MG tablet Commonly known as: PRAVACHOL       TAKE these medications    acetaminophen 325 MG  tablet Commonly known as: TYLENOL Take 650 mg by mouth every 6 (six) hours.   calcium carbonate 1500 (600 Ca) MG Tabs tablet Commonly known as: OSCAL Take 1,500 mg by mouth 2 (two) times daily with a meal.   HIGH POTENCY MULTIVIT/MIN/IRON PO Take 1 tablet by mouth daily.   Klor-Con M20 20 MEQ tablet Generic drug: potassium chloride SA Take 20 mEq by mouth daily.   megestrol 400 MG/10ML suspension Commonly known as: MEGACE Take 10 mLs (400 mg total) by mouth 2 (two) times daily.   Melatonin 5 MG Caps Take 5 mg by mouth at bedtime.   meloxicam 7.5 MG tablet Commonly known as: MOBIC Take 7.5 mg by mouth daily.   methylPREDNISolone 4 MG Tbpk tablet Commonly known as: MEDROL DOSEPAK Medrol Dosepak take as instructed   mirtazapine 45 MG tablet Commonly known as: REMERON Take 1 tablet (45 mg total) by mouth at bedtime.   Namzaric 28-10 MG Cp24 Generic drug: Memantine HCl-Donepezil HCl Take 1 capsule by mouth daily.   NON FORMULARY Diet Change: Dys 3 (chopped) diet, continue thin liquids   PRO-STAT SUGAR FREE PO Take 30 mLs by mouth 2 (two) times daily between meals.   Venelex Oint Apply 1 application topically 3 (three) times daily. Special Instructions: Apply to sacrum and  bilateral buttocks qshift for prevention. Every Shift        Allergies  Allergen Reactions   Dexlansoprazole Other (See Comments)   Sodium Pentobarbital [Pentobarbital Sodium] Nausea And Vomiting     Procedures /Studies:   DG Chest Port 1 View  Result Date: 08/28/2020 CLINICAL DATA:  Difficulty arousing patient today, confusion at baseline but less responsive this morning, shortness of breath, entire family is COVID-19 positive EXAM: PORTABLE CHEST 1 VIEW COMPARISON:  Portable exam 1125 hours compared to 02/18/2020 FINDINGS: Normal heart size, mediastinal contours, and pulmonary vascularity. Emphysematous and chronic bronchitic changes consistent with COPD. Patchy infiltrates RIGHT lung  consistent with pneumonia. No pleural effusion or pneumothorax. Probable RIGHT nipple shadow. Bones demineralized. IMPRESSION: COPD changes with patchy RIGHT lung infiltrates consistent with pneumonia. Electronically Signed   By: Ulyses Southward M.D.   On: 08/28/2020 11:57    Subjective:   Patient was seen and examined 09/03/2020, 8:22 AM Patient stable today. No acute distress.  No issues overnight Stable for discharge.  Discharge Exam:    Vitals:   09/02/20 1429 09/02/20 2112 09/03/20 0540 09/03/20 0600  BP: 121/68 (!) 147/79 (!) 180/73 128/70  Pulse: (!) 58 68 (!) 58   Resp: Temp: 97.6 F (36.4 C) 97.9 F (36.6 C) 98 F (36.7 C)   TempSrc: Oral Oral Oral   SpO2: 98% 95% 100%   Weight:        General: Pt lying comfortably in bed & appears in no obvious distress. Cardiovascular: S1 & S2 heard, RRR, S1/S2 +. No murmurs, rubs, gallops or clicks. No JVD or pedal edema. Respiratory: Clear to auscultation without wheezing, rhonchi or crackles. No increased work of breathing. Abdominal:  Non-distended, non-tender & soft. No organomegaly or masses appreciated. Normal bowel sounds heard. CNS: Alert and oriented. No focal deficits. Extremities: no edema, no cyanosis      The results of significant diagnostics from this hospitalization (including imaging, microbiology, ancillary and laboratory) are listed below for reference.      Microbiology:   Recent Results (from the past 240 hour(s))  Blood Culture (routine x 2)     Status: None   Collection Time: 08/28/20 11:40 AM   Specimen: Right Antecubital; Blood  Result Value Ref Range Status   Specimen Description RIGHT ANTECUBITAL  Final   Special Requests   Final    BOTTLES DRAWN AEROBIC AND ANAEROBIC Blood Culture results may not be optimal due to an inadequate volume of blood received in culture bottles   Culture   Final    NO GROWTH 5 DAYS Performed at [facility], [address], [place], [place] [address]    Report  Status 09/02/2020 FINAL  Final  Blood Culture (routine x 2)     Status: None   Collection Time: 08/28/20 11:42 AM   Specimen: Left Antecubital; Blood  Result Value Ref Range Status   Specimen Description LEFT ANTECUBITAL  Final   Special Requests   Final    BOTTLES DRAWN AEROBIC AND ANAEROBIC Blood Culture results may not be optimal due to an inadequate volume of blood received in culture bottles   Culture   Final    NO GROWTH 5 DAYS Performed at [facility], [address] [place], [place] [address]    Report Status 09/02/2020 FINAL  Final  Resp Panel by RT-PCR (Flu A&B, Covid) Nasopharyngeal Swab     Status: Abnormal   Collection Time: 08/28/20 11:53 AM   Specimen: Nasopharyngeal Swab;  Nasopharyngeal(NP) swabs in vial transport medium  Result Value Ref Range Status   SARS Coronavirus 2 by RT PCR POSITIVE (A) NEGATIVE Final    Comment: CRITICAL RESULT CALLED TO, READ BACK BY AND VERIFIED WITH: TERRAY,M AT 1312 ON 8.17.22 BY RUCINSKI, B (NOTE) SARS-CoV-2 target nucleic acids are DETECTED.  The SARS-CoV-2 RNA is generally detectable in upper respiratory specimens during the acute phase of infection. Positive results are indicative of the presence of the identified virus, but do not rule out bacterial infection or co-infection with other pathogens not detected by the test. Clinical correlation with patient history and other diagnostic information is necessary to determine patient infection status. The expected result is Negative.  Fact Sheet for Patients: BloggerCourse.comhttps://www.fda.gov/media/152166/download  Fact Sheet for Healthcare Providers: SeriousBroker.ithttps://www.fda.gov/media/152162/download  This test is not yet approved or cleared by the [place] FDA and  has been authorized for detection and/or diagnosis of SARS-CoV-2 by FDA under an Emergency Use Authorization (EUA).  This EUA will remain in effect (meanin g this test can be used) for the duration of  the COVID-19 declaration under  Section 564(b)(1) of the Act, 21 U.S.C. section 360bbb-3(b)(1), unless the authorization is terminated or revoked sooner.     Influenza A by PCR NEGATIVE NEGATIVE Final   Influenza B by PCR NEGATIVE NEGATIVE Final    Comment: (NOTE) The Xpert Xpress SARS-CoV-2/FLU/RSV plus assay is intended as an aid in the diagnosis of influenza from Nasopharyngeal swab specimens and should not be used as a sole basis for treatment. Nasal washings and aspirates are unacceptable for Xpert Xpress SARS-CoV-2/FLU/RSV testing.  Fact Sheet for Patients: BloggerCourse.comhttps://www.fda.gov/media/152166/download  Fact Sheet for Healthcare Providers: SeriousBroker.ithttps://www.fda.gov/media/152162/download  This test is not yet approved or cleared by the [place] FDA and has been authorized for detection and/or diagnosis of SARS-CoV-2 by FDA under an Emergency Use Authorization (EUA). This EUA will remain in effect (meaning this test can be used) for the duration of the COVID-19 declaration under Section 564(b)(1) of the Act, 21 U.S.C. section 360bbb-3(b)(1), unless the authorization is terminated or revoked.  Performed at [facility], [address] [place], [place] [address]      Labs:   CBC: Recent Labs  Lab 08/29/20 [phone] 08/30/20 0521 08/31/20 0721 09/01/20 0638 09/02/20 0549  WBC 8.5 7.9 6.3 6.8 5.7  NEUTROABS 7.7 7.0 5.1 5.4 4.3  HGB 14.6 14.1 14.3 14.2 13.0  HCT 45.3 44.1 44.9 43.1 39.9  MCV 89.3 88.7 91.4 86.5 87.5  PLT 226 223 236 245 217   Basic Metabolic Panel: Recent Labs  Lab 08/29/20 0625 08/30/20 0521 08/31/20 0721 09/01/20 0638 09/02/20 0549  NA 146* 146* 140 137 138  K 3.8 3.4* 5.4* 3.6 3.5  CL 112* 109 109 103 104  CO2 23 29 27 27 27   GLUCOSE 177* 187* 173* 110* 96  BUN 50* 46* 41* 21 19  CREATININE 0.78 0.67 0.65 0.47 0.47  CALCIUM 8.9 9.4 9.3 8.6* 8.7*   Liver Function Tests: Recent Labs  Lab 08/29/20 0625 08/30/20 0521 08/31/20 0721 09/01/20 0638 09/02/20 0549  AST 44* 30  26 28 19   ALT 37 34 33 36 29  ALKPHOS 113 100 100 93 83  BILITOT 0.9 0.9 0.5 0.9 0.7  PROT 6.8 6.1* 6.4* 5.7* 4.9*  ALBUMIN 3.2* 2.7* 2.8* 2.6* 2.3*   BNP (last 3 results) No results for input(s): BNP in the last 8760 hours. Cardiac Enzymes: No results for input(s): CKTOTAL, CKMB, CKMBINDEX, TROPONINI in the last 168 hours. CBG:  No results for input(s): GLUCAP in the last 168 hours. Hgb A1c No results for input(s): HGBA1C in the last 72 hours. Lipid Profile No results for input(s): CHOL, HDL, LDLCALC, TRIG, CHOLHDL, LDLDIRECT in the last 72 hours. Thyroid function studies No results for input(s): TSH, T4TOTAL, T3FREE, THYROIDAB in the last 72 hours.  Invalid input(s): FREET3 Anemia work up Recent Labs    09/01/20 0638 09/02/20 0549  FERRITIN 217 154   Urinalysis    Component Value Date/Time   COLORURINE YELLOW 02/19/2020 0142   APPEARANCEUR CLOUDY (A) 02/19/2020 0142   LABSPEC 1.028 02/19/2020 0142   PHURINE 5.0 02/19/2020 0142   GLUCOSEU NEGATIVE 02/19/2020 0142   HGBUR MODERATE (A) 02/19/2020 0142   BILIRUBINUR NEGATIVE 02/19/2020 0142   KETONESUR 5 (A) 02/19/2020 0142   PROTEINUR 30 (A) 02/19/2020 0142   NITRITE POSITIVE (A) 02/19/2020 0142   LEUKOCYTESUR NEGATIVE 02/19/2020 0142         Time coordinating discharge: Over 45 minutes  SIGNED: Kendell Bane, MD, FACP, [facility]. [facility],  Please use [email or web address] to Page If 7PM-7AM, please contact night-coverage [email or web address], Password [email or web address] 09/03/2020, 8:22 AM

## 2020-09-03 NOTE — Progress Notes (Signed)
PT Cancellation Note  Patient Details Name: Destiny Lopez MRN: [id number] DOB: [birth year]   Cancelled Treatment:    Reason Eval/Treat Not Completed: PT screened, no needs identified, will sign off.  Patient going home to follow up with palliative care, pursue hospice at home.   10:39 AM, 09/03/20 Ocie Bob, MPT Physical Therapist with [facility] 336 [phone] office [phone] mobile phone

## 2020-09-03 NOTE — Progress Notes (Signed)
Pts husband contacted and made aware of EMS not arriving just yet and the transfer home may not happen until tomorrow. Husband has no further questions and would like to receive a call from patients nurse tomorrow once EMS arrives.

## 2020-09-03 NOTE — Progress Notes (Signed)
Contacted husband [name] Girten and informed him of discharge orders. Husband says he will be here around 1:00pm to go over discharge paperwork. Nurse informed.

## 2020-09-03 NOTE — Progress Notes (Signed)
Pt will be leaving via EMS.

## 2020-09-03 NOTE — Progress Notes (Signed)
Attempted several times to get patient to take morning medications. Patient sleeping. Will wake up and look at me when stimulated but then closes eyes back and will squeeze them shut to try to go back to sleep. Attempted to see if a small amount of applesauce would stimulate patient to want to take medication but was unsuccessful.

## 2020-09-03 NOTE — Progress Notes (Signed)
Palliative: Conference with attending and transition of care team related to patient condition and disposition.  [place] form/DNR completed and placed on chart. Discharging home today with hospice of [facility] to follow.  No charge Lillia Carmel, NP Palliative medicine team Team phone [phone] Greater than 50% of this time was spent counseling and coordinating care related to the above assessment and plan.

## 2020-09-09 ENCOUNTER — Ambulatory Visit: Payer: Medicare PPO | Admitting: Neurology

## 2020-11-12 DEATH — deceased

## 2022-03-12 ENCOUNTER — Encounter: Payer: Self-pay | Admitting: Radiology

## 2022-10-18 IMAGING — DX DG FEMUR 2+V*L*
3 series · 3 of 3 positions shown · non-contrast
Comparison: Hip radiograph February 18, 2020.

CLINICAL DATA: Preoperative planning.  Close left hip fracture.

EXAM:
LEFT FEMUR 2 VIEWS

[femur ap (1 of 2)]
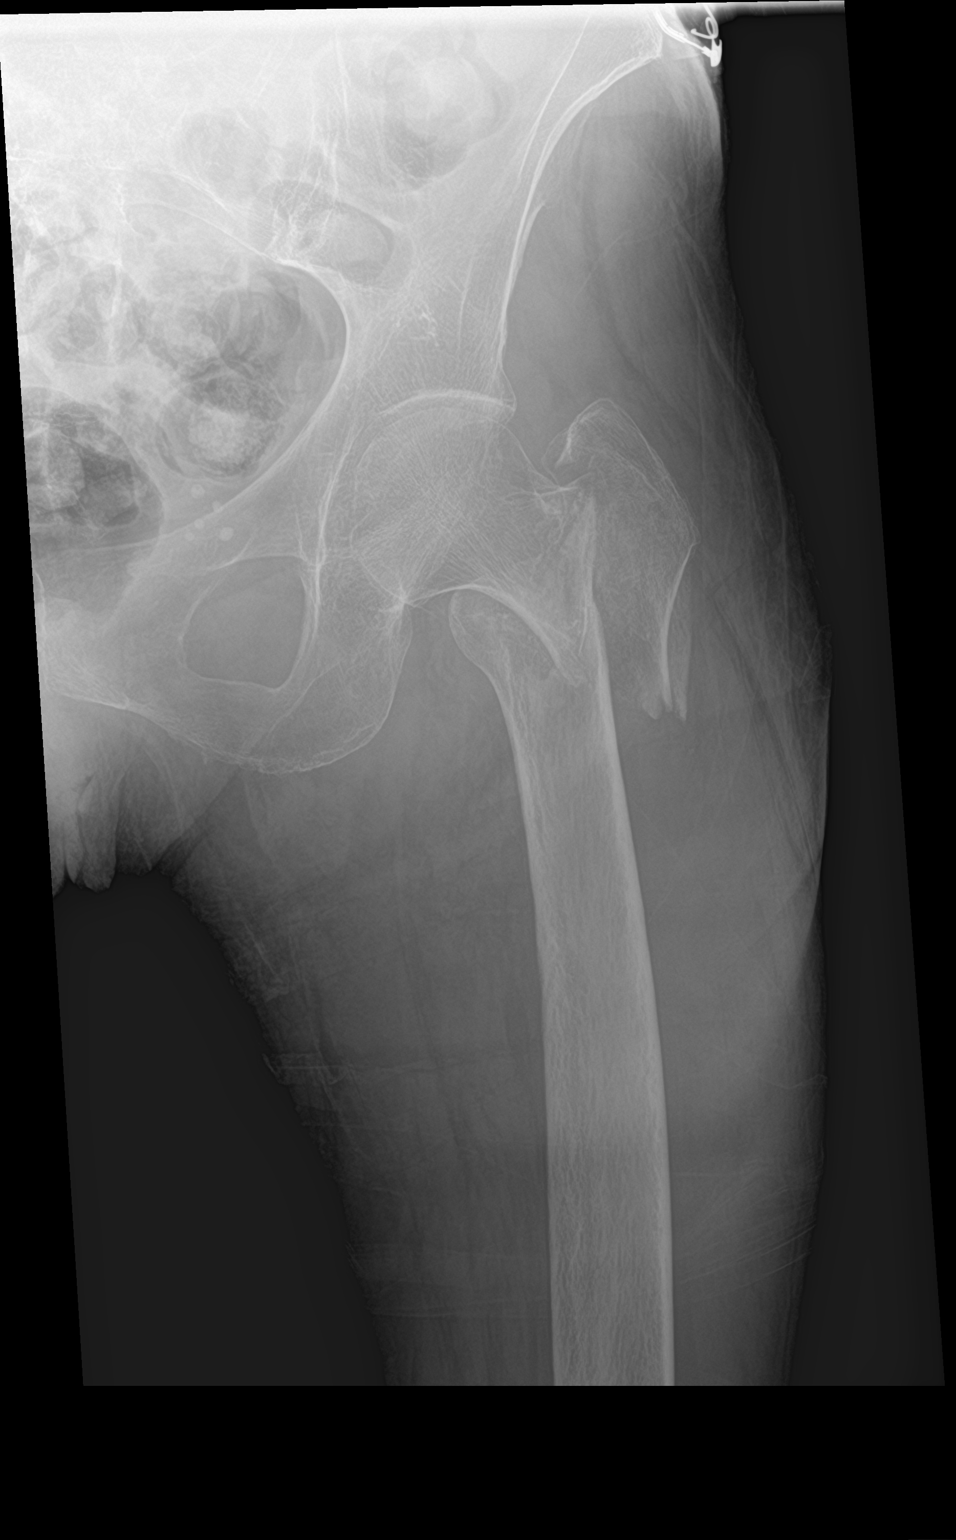

[femur ap (2 of 2)]
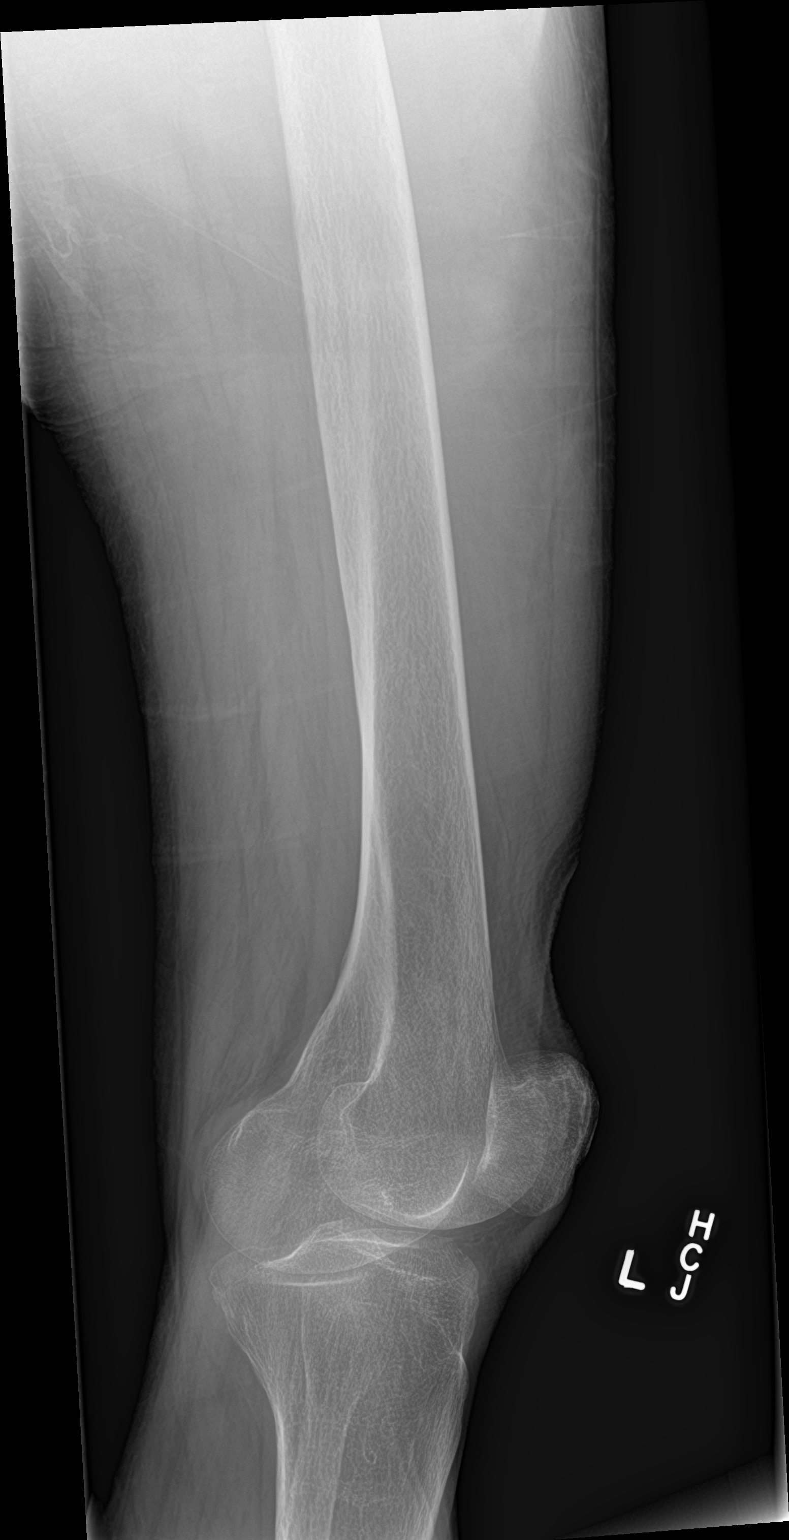

[femur lat]
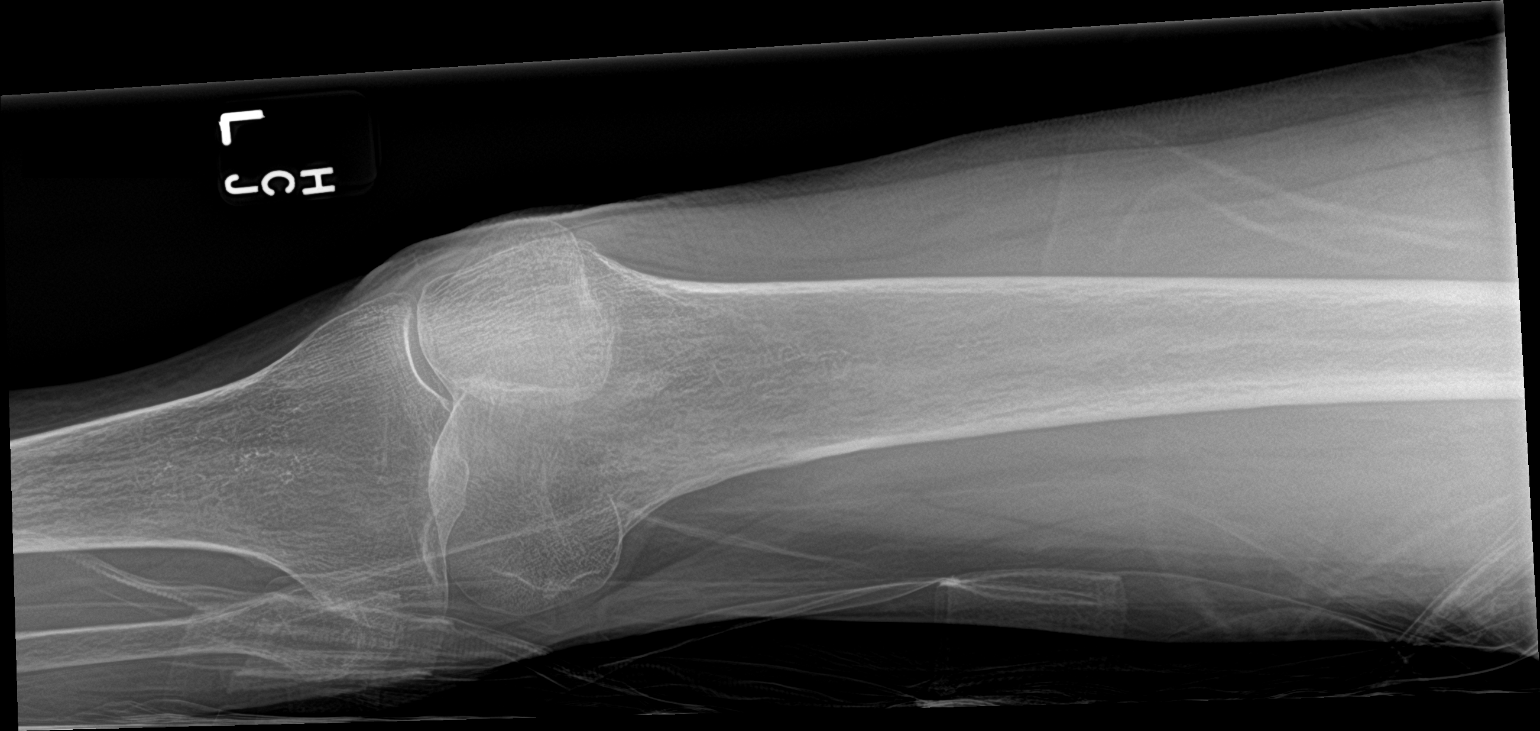

[3 of 3 positions shown; findings below may reference images not displayed]

FINDINGS: Similar appearance of the left femoral intratrochanteric fracture
with mild impaction, varus angulation and displacement of the distal
fracture fragments. No distal femur fracture. Soft tissues are
unremarkable.
IMPRESSION: Unchanged left femoral intratrochanteric fracture. No distal femur
fracture.
